# Patient Record
Sex: Male | Born: 1955
Health system: Southern US, Community
[De-identification: ages and names within clinical notes are randomized; demographics above are authoritative.]

## PROBLEM LIST (undated history)

## (undated) DIAGNOSIS — F32A Depression, unspecified: Secondary | ICD-10-CM

## (undated) DIAGNOSIS — G8929 Other chronic pain: Secondary | ICD-10-CM

## (undated) DIAGNOSIS — F329 Major depressive disorder, single episode, unspecified: Secondary | ICD-10-CM

## (undated) DIAGNOSIS — R739 Hyperglycemia, unspecified: Secondary | ICD-10-CM

## (undated) DIAGNOSIS — I82409 Acute embolism and thrombosis of unspecified deep veins of unspecified lower extremity: Secondary | ICD-10-CM

## (undated) DIAGNOSIS — I2699 Other pulmonary embolism without acute cor pulmonale: Secondary | ICD-10-CM

## (undated) DIAGNOSIS — F411 Generalized anxiety disorder: Secondary | ICD-10-CM

## (undated) HISTORY — DX: Generalized anxiety disorder: F41.1

## (undated) HISTORY — DX: Hyperglycemia, unspecified: R73.9

## (undated) HISTORY — DX: Major depressive disorder, single episode, unspecified: F32.9

## (undated) HISTORY — DX: Depression, unspecified: F32.A

## (undated) HISTORY — DX: Other pulmonary embolism without acute cor pulmonale: I26.99

## (undated) HISTORY — DX: Other chronic pain: G89.29

## (undated) HISTORY — DX: Acute embolism and thrombosis of unspecified deep veins of unspecified lower extremity: I82.409

---

## 2015-12-22 ENCOUNTER — Encounter: Payer: Self-pay | Admitting: Pulmonary Disease

## 2015-12-23 ENCOUNTER — Encounter: Payer: Self-pay | Admitting: Pulmonary Disease

## 2015-12-23 ENCOUNTER — Ambulatory Visit (INDEPENDENT_AMBULATORY_CARE_PROVIDER_SITE_OTHER): Payer: BLUE CROSS/BLUE SHIELD | Admitting: Pulmonary Disease

## 2015-12-23 ENCOUNTER — Encounter (INDEPENDENT_AMBULATORY_CARE_PROVIDER_SITE_OTHER): Payer: Self-pay

## 2015-12-23 VITALS — BP 122/90 | HR 82 | Ht 70.0 in | Wt 273.0 lb

## 2015-12-23 DIAGNOSIS — R0600 Dyspnea, unspecified: Secondary | ICD-10-CM

## 2015-12-23 DIAGNOSIS — R0683 Snoring: Secondary | ICD-10-CM

## 2015-12-23 MED ORDER — PREDNISONE 10 MG PO TABS: ORAL_TABLET | ORAL | Status: DC

## 2015-12-23 NOTE — Progress Notes (Addendum)
Subjective:    Patient ID: Randy GageJohn Medina, male    DOB: [birth year], 60 y.o.   MRN: [id number]  HPI Consult for evaluation of dyspnea  [name] is a 60 year old with heavy smoking history, active smoker referred for evaluation of COPD. He reports increasing dyspnea, wheezing over the past 2 weeks. He got a prednisone taper from his primary care physician that is finished today but however he still has persistent symptoms.  He used to live at [place], [place] and recently moved to [place] area this year for his wife's job reason. He still spends his time between [place] and [place]. He has chronic pain issues, back pain. His pain doctor is still in [place] and he travels there to get his pain medications from there. As per PCP note he has H/O PE DVT. He saw a hematologist in [place] and was told he had a mutation and will need to be on life long anticoagulation.  Social History: He is a current every day smoker with 30 pack year smoking history. He does not have any alcohol, drug use.  Family History: Noncontributory  Past Medical History:  Diagnosis Date  . Chronic pain   . Depression   . DVT (deep venous thrombosis) (HCC)   . GAD (generalized anxiety disorder)   . Hyperglycemia   . PE (pulmonary embolism)     Current Outpatient Prescriptions:  .  ADVAIR HFA 115-21 MCG/ACT inhaler, Inhale 2 puffs into the lungs 2 (two) times daily., Disp: , Rfl: 1 .  ALPRAZolam (XANAX) 1 MG tablet, Take 1 mg by mouth 3 (three) times daily as needed., Disp: , Rfl: 0 .  OXYCONTIN 40 MG 12 hr tablet, Take 40 mg by mouth 3 (three) times daily as needed., Disp: , Rfl: 0 .  OXYCONTIN 60 MG 12 hr tablet, Take 60 mg by mouth 4 (four) times daily., Disp: , Rfl: 0 .  VENTOLIN HFA 108 (90 Base) MCG/ACT inhaler, Inhale 2 puffs into the lungs every 6 (six) hours as needed., Disp: , Rfl: 1 .  Vitamin D, Ergocalciferol, (DRISDOL) 50000 units CAPS capsule, Take 1 capsule by mouth 2 (two) times a week., Disp:  , Rfl: 0 .  XARELTO 20 MG TABS tablet, Take 20 mg by mouth daily., Disp: , Rfl: 0 .  zolpidem (AMBIEN) 10 MG tablet, Take 10 mg by mouth at bedtime as needed., Disp: , Rfl: 0 .  predniSONE (DELTASONE) 10 MG tablet, Take 3 tabs x3 days, then 2 tabs x3 days, then 1 tab x3 days and stop., Disp: 18 tablet, Rfl: 0   Review of Systems     Objective:   Physical Exam Blood pressure 122/90, pulse 82, height 5\' 10"  (1.778 m), weight 273 lb (123.8 kg), SpO2 95 %. Gen: No apparent distress Neuro: No gross focal deficits. HEENT: No JVD, lymphadenopathy, thyromegaly. RS: + Exp wheeze, No crackles CVS: S1-S2 heard, no murmurs rubs gallops. Abdomen: Soft, positive bowel sounds. Musculoskeletal: No edema.    Assessment & Plan:  Consult for evaluation of dyspnea.  [name] likely has severe COPD based on his smoking history and symptoms. He has just finished a course of prednisone for exacerbation but is still symptomatic in the office today. I will give him another short course of prednisone. He is already started on Advair which he'll continue to use. I will schedule him for pulmonary function tests. He also has symptoms of sleep apnea and will need sleep study for further evaluation.  Plan: - Continue Advair - Short prednisone taper - Schedule PFTs and sleep study.  Chilton Greathouse MD [facility] Pager [phone] If no answer or after 3pm call: [phone] 01/12/2016, 8:01 AM

## 2015-12-23 NOTE — Patient Instructions (Signed)
We will give a prescription for prednisone taper starting to 30 mg. Reduce dose by 10 mg every 3 days. Use it if you have worsening of your breathing. We will schedule you for PFTs and split night sleep study Continue using the advair  Return to clinic in 3 months

## 2016-02-03 ENCOUNTER — Encounter (HOSPITAL_BASED_OUTPATIENT_CLINIC_OR_DEPARTMENT_OTHER): Payer: BLUE CROSS/BLUE SHIELD

## 2016-03-24 ENCOUNTER — Ambulatory Visit: Payer: BLUE CROSS/BLUE SHIELD | Admitting: Pulmonary Disease

## 2016-04-02 ENCOUNTER — Encounter (HOSPITAL_BASED_OUTPATIENT_CLINIC_OR_DEPARTMENT_OTHER): Payer: BLUE CROSS/BLUE SHIELD

## 2016-06-18 DIAGNOSIS — M5126 Other intervertebral disc displacement, lumbar region: Secondary | ICD-10-CM | POA: Diagnosis not present

## 2016-06-18 DIAGNOSIS — M545 Low back pain: Secondary | ICD-10-CM | POA: Diagnosis not present

## 2016-06-18 DIAGNOSIS — Z79899 Other long term (current) drug therapy: Secondary | ICD-10-CM | POA: Diagnosis not present

## 2016-06-18 DIAGNOSIS — M6289 Other specified disorders of muscle: Secondary | ICD-10-CM | POA: Diagnosis not present

## 2016-07-02 DIAGNOSIS — M6289 Other specified disorders of muscle: Secondary | ICD-10-CM | POA: Diagnosis not present

## 2016-07-02 DIAGNOSIS — M5126 Other intervertebral disc displacement, lumbar region: Secondary | ICD-10-CM | POA: Diagnosis not present

## 2016-07-02 DIAGNOSIS — M545 Low back pain: Secondary | ICD-10-CM | POA: Diagnosis not present

## 2016-07-02 DIAGNOSIS — Z79899 Other long term (current) drug therapy: Secondary | ICD-10-CM | POA: Diagnosis not present

## 2016-07-16 DIAGNOSIS — M6289 Other specified disorders of muscle: Secondary | ICD-10-CM | POA: Diagnosis not present

## 2016-07-16 DIAGNOSIS — Z79899 Other long term (current) drug therapy: Secondary | ICD-10-CM | POA: Diagnosis not present

## 2016-07-16 DIAGNOSIS — M5126 Other intervertebral disc displacement, lumbar region: Secondary | ICD-10-CM | POA: Diagnosis not present

## 2016-07-16 DIAGNOSIS — M545 Low back pain: Secondary | ICD-10-CM | POA: Diagnosis not present

## 2016-07-30 DIAGNOSIS — Z79899 Other long term (current) drug therapy: Secondary | ICD-10-CM | POA: Diagnosis not present

## 2016-07-30 DIAGNOSIS — M5126 Other intervertebral disc displacement, lumbar region: Secondary | ICD-10-CM | POA: Diagnosis not present

## 2016-07-30 DIAGNOSIS — M6289 Other specified disorders of muscle: Secondary | ICD-10-CM | POA: Diagnosis not present

## 2016-07-30 DIAGNOSIS — M545 Low back pain: Secondary | ICD-10-CM | POA: Diagnosis not present

## 2016-08-12 DIAGNOSIS — M5126 Other intervertebral disc displacement, lumbar region: Secondary | ICD-10-CM | POA: Diagnosis not present

## 2016-08-12 DIAGNOSIS — M5136 Other intervertebral disc degeneration, lumbar region: Secondary | ICD-10-CM | POA: Diagnosis not present

## 2016-08-12 DIAGNOSIS — M545 Low back pain: Secondary | ICD-10-CM | POA: Diagnosis not present

## 2016-08-12 DIAGNOSIS — R413 Other amnesia: Secondary | ICD-10-CM | POA: Diagnosis not present

## 2016-08-12 DIAGNOSIS — R2689 Other abnormalities of gait and mobility: Secondary | ICD-10-CM | POA: Diagnosis not present

## 2016-08-12 DIAGNOSIS — M4802 Spinal stenosis, cervical region: Secondary | ICD-10-CM | POA: Diagnosis not present

## 2016-08-12 DIAGNOSIS — Z79899 Other long term (current) drug therapy: Secondary | ICD-10-CM | POA: Diagnosis not present

## 2016-08-12 DIAGNOSIS — M6289 Other specified disorders of muscle: Secondary | ICD-10-CM | POA: Diagnosis not present

## 2016-08-20 DIAGNOSIS — R201 Hypoesthesia of skin: Secondary | ICD-10-CM | POA: Diagnosis not present

## 2016-08-20 DIAGNOSIS — M79604 Pain in right leg: Secondary | ICD-10-CM | POA: Diagnosis not present

## 2016-08-20 DIAGNOSIS — M545 Low back pain: Secondary | ICD-10-CM | POA: Diagnosis not present

## 2016-08-25 DIAGNOSIS — M545 Low back pain: Secondary | ICD-10-CM | POA: Diagnosis not present

## 2016-08-25 DIAGNOSIS — M5126 Other intervertebral disc displacement, lumbar region: Secondary | ICD-10-CM | POA: Diagnosis not present

## 2016-08-25 DIAGNOSIS — Z79899 Other long term (current) drug therapy: Secondary | ICD-10-CM | POA: Diagnosis not present

## 2016-08-25 DIAGNOSIS — M6289 Other specified disorders of muscle: Secondary | ICD-10-CM | POA: Diagnosis not present

## 2016-09-09 DIAGNOSIS — M5126 Other intervertebral disc displacement, lumbar region: Secondary | ICD-10-CM | POA: Diagnosis not present

## 2016-09-09 DIAGNOSIS — M6289 Other specified disorders of muscle: Secondary | ICD-10-CM | POA: Diagnosis not present

## 2016-09-09 DIAGNOSIS — M545 Low back pain: Secondary | ICD-10-CM | POA: Diagnosis not present

## 2016-09-09 DIAGNOSIS — Z79899 Other long term (current) drug therapy: Secondary | ICD-10-CM | POA: Diagnosis not present

## 2016-09-23 DIAGNOSIS — M545 Low back pain: Secondary | ICD-10-CM | POA: Diagnosis not present

## 2016-09-23 DIAGNOSIS — M5126 Other intervertebral disc displacement, lumbar region: Secondary | ICD-10-CM | POA: Diagnosis not present

## 2016-09-23 DIAGNOSIS — Z79899 Other long term (current) drug therapy: Secondary | ICD-10-CM | POA: Diagnosis not present

## 2016-09-23 DIAGNOSIS — M6289 Other specified disorders of muscle: Secondary | ICD-10-CM | POA: Diagnosis not present

## 2016-10-04 DIAGNOSIS — M6289 Other specified disorders of muscle: Secondary | ICD-10-CM | POA: Diagnosis not present

## 2016-10-04 DIAGNOSIS — M545 Low back pain: Secondary | ICD-10-CM | POA: Diagnosis not present

## 2016-10-04 DIAGNOSIS — M5126 Other intervertebral disc displacement, lumbar region: Secondary | ICD-10-CM | POA: Diagnosis not present

## 2016-10-04 DIAGNOSIS — Z79899 Other long term (current) drug therapy: Secondary | ICD-10-CM | POA: Diagnosis not present

## 2016-10-15 DIAGNOSIS — M6289 Other specified disorders of muscle: Secondary | ICD-10-CM | POA: Diagnosis not present

## 2016-10-15 DIAGNOSIS — M545 Low back pain: Secondary | ICD-10-CM | POA: Diagnosis not present

## 2016-10-15 DIAGNOSIS — M5126 Other intervertebral disc displacement, lumbar region: Secondary | ICD-10-CM | POA: Diagnosis not present

## 2016-10-15 DIAGNOSIS — Z79899 Other long term (current) drug therapy: Secondary | ICD-10-CM | POA: Diagnosis not present

## 2017-03-22 ENCOUNTER — Ambulatory Visit: Payer: BLUE CROSS/BLUE SHIELD | Admitting: Pulmonary Disease

## 2017-03-23 ENCOUNTER — Encounter: Payer: Self-pay | Admitting: Pulmonary Disease

## 2017-03-23 ENCOUNTER — Ambulatory Visit (INDEPENDENT_AMBULATORY_CARE_PROVIDER_SITE_OTHER): Payer: BLUE CROSS/BLUE SHIELD | Admitting: Pulmonary Disease

## 2017-03-23 ENCOUNTER — Ambulatory Visit (INDEPENDENT_AMBULATORY_CARE_PROVIDER_SITE_OTHER)
Admission: RE | Admit: 2017-03-23 | Discharge: 2017-03-23 | Disposition: A | Discharge status: Home / Self Care | Payer: BLUE CROSS/BLUE SHIELD | Source: Ambulatory Visit | Attending: Pulmonary Disease | Admitting: Pulmonary Disease

## 2017-03-23 VITALS — BP 130/68 | HR 95 | Ht 71.0 in | Wt 268.0 lb

## 2017-03-23 DIAGNOSIS — R0600 Dyspnea, unspecified: Secondary | ICD-10-CM | POA: Diagnosis not present

## 2017-03-23 DIAGNOSIS — Z122 Encounter for screening for malignant neoplasm of respiratory organs: Secondary | ICD-10-CM

## 2017-03-23 DIAGNOSIS — F17210 Nicotine dependence, cigarettes, uncomplicated: Secondary | ICD-10-CM

## 2017-03-23 MED ORDER — MOMETASONE FURO-FORMOTEROL FUM 200-5 MCG/ACT IN AERO: 2.0000 | INHALATION_SPRAY | Freq: Two times a day (BID) | RESPIRATORY_TRACT | 0 refills | Status: DC

## 2017-03-23 NOTE — Progress Notes (Signed)
Randy Medina    [id number]    [birth year]  Primary Care Physician:Randy Medina, Randy Medina, Randy Medina  Referring Physician: [name], Randy Medina, Randy Medina [address] [place], [place] [address]  Chief complaint:   Follow up for COPD, dyspnea  HPI: [name] is a 61 year old with heavy smoking history, active smoker referred for evaluation of COPD. He reports increasing dyspnea, wheezing over the past 2 weeks. He got a prednisone taper from his primary care physician that is finished today but however he still has persistent symptoms.  He used to live at [place] and recently moved to [place] area this year for his wife's job reason. He still spends his time between [place] and [place]. He has chronic pain issues, back pain. His pain doctor is still in [place] and he travels there to get his pain medications from there. As per PCP note he has H/O PE DVT. He saw a hematologist in [place] and was told he had a mutation and will need to be on life long anticoagulation.  Interim History:  At last visit PFTs and sleep study were ordered but he did not follow-up. He is since moved to [place] from [place] permanently and is here for follow-up. He continues to smoke but is trying to quit. He has dyspnea on exertion, cough with sputum production, occasional wheeze. No fevers, chills.  Outpatient Encounter Prescriptions as of 03/23/2017  Medication Sig  . ADVAIR HFA 115-21 MCG/ACT inhaler Inhale 2 puffs into the lungs 2 (two) times daily.  Marland Kitchen. ALPRAZolam (XANAX) 1 MG tablet Take 1 mg by mouth 3 (three) times daily as needed.  . OXYCONTIN 40 MG 12 hr tablet Take 40 mg by mouth 3 (three) times daily as needed.  . OXYCONTIN 60 MG 12 hr tablet Take 60 mg by mouth 4 (four) times daily.  . VENTOLIN HFA 108 (90 Base) MCG/ACT inhaler Inhale 2 puffs into the lungs every 6 (six) hours as needed.  . Vitamin D, Ergocalciferol, (DRISDOL) 50000 units CAPS capsule Take 1 capsule by mouth 2 (two) times a week.  Carlena Hurl.  XARELTO 20 MG TABS tablet Take 20 mg by mouth daily.  Marland Kitchen. zolpidem (AMBIEN) 10 MG tablet Take 10 mg by mouth at bedtime as needed.  . [DISCONTINUED] predniSONE (DELTASONE) 10 MG tablet Take 3 tabs x3 days, then 2 tabs x3 days, then 1 tab x3 days and stop.   No facility-administered encounter medications on file as of 03/23/2017.     Allergies as of 03/23/2017  . (No Known Allergies)    Past Medical History:  Diagnosis Date  . Chronic pain   . Depression   . DVT (deep venous thrombosis) (HCC)   . GAD (generalized anxiety disorder)   . Hyperglycemia   . PE (pulmonary embolism)     No past surgical history on file.  Family History  Problem Relation Age of Onset  . Lung cancer Father   . Diabetes Maternal Grandfather   . Diabetes Paternal Grandfather     Social History   Social History  . Marital status: Married    Spouse name: N/A  . Number of children: N/A  . Years of education: N/A   Occupational History  . Not on file.   Social History Main Topics  . Smoking status: Current Every Day Smoker    Packs/day: 0.50    Years: 30.00  . Smokeless tobacco: Never Used  . Alcohol use No  . Drug use:  No  . Sexual activity: Not on file   Other Topics Concern  . Not on file   Social History Narrative   Recently moved here from [place] 2017   Married   Retired International aid/development worker, on disability for COPD and back pain   Frequent travel between [place] and [place]          Review of systems: Review of Systems  Constitutional: Negative for fever and chills.  HENT: Negative.   Eyes: Negative for blurred vision.  Respiratory: as per HPI  Cardiovascular: Negative for chest pain and palpitations.  Gastrointestinal: Negative for vomiting, diarrhea, blood per rectum. Genitourinary: Negative for dysuria, urgency, frequency and hematuria.  Musculoskeletal: Negative for myalgias, back pain and joint pain.  Skin: Negative for itching and rash.  Neurological: Negative for dizziness,  tremors, focal weakness, seizures and loss of consciousness.  Endo/Heme/Allergies: Negative for environmental allergies.  Psychiatric/Behavioral: Negative for depression, suicidal ideas and hallucinations.  All other systems reviewed and are negative.  Physical Exam: Blood pressure 130/68, pulse 95, height 5\' 11"  (1.803 m), weight 268 lb (121.6 kg), SpO2 94 %. Gen:      No acute distress HEENT:  EOMI, sclera anicteric Neck:     No masses; no thyromegaly Lungs:    Scattered exp wheeze; normal respiratory effort CV:         Regular rate and rhythm; no murmurs Abd:      + bowel sounds; soft, non-tender; no palpable masses, no distension Ext:    No edema; adequate peripheral perfusion Skin:      Warm and dry; no rash Neuro: alert and oriented x 3 Psych: normal mood and affect  Data Reviewed: None available.  Assessment:  Consult for evaluation of dyspnea. [name] likely has COPD based on his smoking history and symptoms.  He continues on the Advair but still has symptoms. He has responded well to [facility] in the past and would like to switch his inhalers to that. I will schedule him for pulmonary function tests and get chest x-ray.   Active smoker He has tried nicotine patches, Chantix in the past without success. He is asking about vasping. I told him that if this is only thing that'll get him off cigarettes then it'll be worthwhile to try it Time spent counseling-5 minutes Referred for low-dose screening CT of the chest  Suspected sleep apnea He also has symptoms of sleep apnea. Sleep study was discussed but he deferred as his symptoms are improving. We will readdress this at next visit.  More then 1/2 the time of the 40 min visit was spent in counseling and/or coordination of care with the patient and family.  Plan/Recommendations: - Stop advair. Start dulera. Continue albuterol inhaler - CXR, PFTs.  - Smoking cessation. Want to try vaping.  - Referral for low dose screening  CT of the chest.    Chilton Greathouse Randy Medina [facility] Pager [phone] 03/23/2017, 10:47 AM  CC: Randy Corn, Randy Medina

## 2017-03-23 NOTE — Patient Instructions (Signed)
We will stop the Advair and start Dulera 200 Continue the albuterol inhaler as needed We'll schedule you for pulmonary function tests Continue to work on smoking cessation Follow-up and 1-2 months.

## 2017-05-09 ENCOUNTER — Encounter: Payer: Self-pay | Admitting: Pulmonary Disease

## 2017-05-09 ENCOUNTER — Ambulatory Visit (INDEPENDENT_AMBULATORY_CARE_PROVIDER_SITE_OTHER): Payer: BLUE CROSS/BLUE SHIELD | Admitting: Pulmonary Disease

## 2017-05-09 VITALS — BP 110/74 | HR 76 | Ht 69.0 in | Wt 264.0 lb

## 2017-05-09 DIAGNOSIS — J449 Chronic obstructive pulmonary disease, unspecified: Secondary | ICD-10-CM

## 2017-05-09 DIAGNOSIS — R05 Cough: Secondary | ICD-10-CM | POA: Diagnosis not present

## 2017-05-09 DIAGNOSIS — R0600 Dyspnea, unspecified: Secondary | ICD-10-CM | POA: Diagnosis not present

## 2017-05-09 DIAGNOSIS — R059 Cough, unspecified: Secondary | ICD-10-CM

## 2017-05-09 DIAGNOSIS — Z122 Encounter for screening for malignant neoplasm of respiratory organs: Secondary | ICD-10-CM

## 2017-05-09 LAB — PULMONARY FUNCTION TEST
DL/VA % pred: 115 %
DL/VA: 5.27 ml/min/mmHg/L
DLCO cor % pred: 98 %
DLCO cor: 30.42 ml/min/mmHg
DLCO unc % pred: 95 %
DLCO unc: 29.52 ml/min/mmHg
FEF 25-75 Post: 1.84 L/sec
FEF 25-75 Pre: 1.34 L/sec
FEF2575-%Change-Post: 37 %
FEF2575-%Pred-Post: 63 %
FEF2575-%Pred-Pre: 46 %
FEV1-%Change-Post: 12 %
FEV1-%Pred-Post: 68 %
FEV1-%Pred-Pre: 60 %
FEV1-Post: 2.38 L
FEV1-Pre: 2.12 L
FEV1FVC-%Change-Post: 3 %
FEV1FVC-%Pred-Pre: 86 %
FEV6-%Change-Post: 7 %
FEV6-%Pred-Post: 77 %
FEV6-%Pred-Pre: 72 %
FEV6-Post: 3.43 L
FEV6-Pre: 3.19 L
FEV6FVC-%Change-Post: -1 %
FEV6FVC-%Pred-Post: 103 %
FEV6FVC-%Pred-Pre: 104 %
FVC-%Change-Post: 8 %
FVC-%Pred-Post: 75 %
FVC-%Pred-Pre: 69 %
FVC-Post: 3.5 L
FVC-Pre: 3.21 L
Post FEV1/FVC ratio: 68 %
Post FEV6/FVC ratio: 98 %
Pre FEV1/FVC ratio: 66 %
Pre FEV6/FVC Ratio: 99 %
RV % pred: 192 %
RV: 4.18 L
TLC % pred: 109 %
TLC: 7.45 L

## 2017-05-09 MED ORDER — MOMETASONE FURO-FORMOTEROL FUM 200-5 MCG/ACT IN AERO: 2.0000 | INHALATION_SPRAY | Freq: Two times a day (BID) | RESPIRATORY_TRACT | 3 refills | Status: DC

## 2017-05-09 MED ORDER — PREDNISONE 10 MG PO TABS: 10.0000 mg | ORAL_TABLET | Freq: Every day | ORAL | 0 refills | Status: DC

## 2017-05-09 NOTE — Progress Notes (Signed)
PFT done today. 

## 2017-05-09 NOTE — Progress Notes (Signed)
Randy Medina    [id number]    [birth year]  Primary Care Physician:Russo, Jonny RuizJohn, MD  Referring Physician: Creola Cornusso, Bobbie, MD [address] [place], [place] [address]  Chief complaint:   Follow up for COPD, dyspnea  HPI: [name] is a 61 year old with heavy smoking history, active smoker referred for evaluation of COPD. He reports increasing dyspnea, wheezing over the past 2 weeks. He got a prednisone taper from his primary care physician that is finished today but however he still has persistent symptoms.  He used to live at [place] and recently moved to [place] area this year for his wife's job reason. He still spends his time between [place] and [place]. He has chronic pain issues, back pain. His pain doctor is still in [place] and he travels there to get his pain medications from there. As per PCP note he has H/O PE DVT. He saw a hematologist in [place] and was told he had a mutation and will need to be on life long anticoagulation.  Interim History:  Continues to have chronic dyspnea on exertion, cough with white sputum production.  He had used samples of Dulera in the past and feels that this helps much better with his breathing than advair.  He is requesting prednisone taper.  Outpatient Encounter Medications as of 05/09/2017  Medication Sig  . ALPRAZolam (XANAX) 1 MG tablet Take 1 mg by mouth 3 (three) times daily as needed.  . fluticasone-salmeterol (ADVAIR HFA) 115-21 MCG/ACT inhaler Inhale 2 puffs into the lungs 2 (two) times daily.  . furosemide (LASIX) 20 MG tablet Take 20 mg by mouth daily.  . mometasone-formoterol (DULERA) 200-5 MCG/ACT AERO Inhale 2 puffs into the lungs 2 (two) times daily.  . OXYCONTIN 60 MG 12 hr tablet Take 80 mg by mouth 3 (three) times daily.   . VENTOLIN HFA 108 (90 Base) MCG/ACT inhaler Inhale 2 puffs into the lungs every 6 (six) hours as needed.  . Vitamin D, Ergocalciferol, (DRISDOL) 50000 units CAPS capsule Take 1 capsule  by mouth 2 (two) times a week.  Carlena Hurl. XARELTO 20 MG TABS tablet Take 20 mg by mouth daily.  Marland Kitchen. zolpidem (AMBIEN) 10 MG tablet Take 10 mg by mouth at bedtime as needed.  . [DISCONTINUED] OXYCONTIN 40 MG 12 hr tablet Take 40 mg by mouth 3 (three) times daily as needed.   No facility-administered encounter medications on file as of 05/09/2017.     Allergies as of 05/09/2017  . (No Known Allergies)    Past Medical History:  Diagnosis Date  . Chronic pain   . Depression   . DVT (deep venous thrombosis) (HCC)   . GAD (generalized anxiety disorder)   . Hyperglycemia   . PE (pulmonary embolism)     History reviewed. No pertinent surgical history.  Family History  Problem Relation Age of Onset  . Lung cancer Father   . Diabetes Maternal Grandfather   . Diabetes Paternal Grandfather     Social History   Socioeconomic History  . Marital status: Married    Spouse name: Not on file  . Number of children: Not on file  . Years of education: Not on file  . Highest education level: Not on file  Social Needs  . Financial resource strain: Not on file  . Food insecurity - worry: Not on file  . Food insecurity - inability: Not on file  . Transportation needs - medical: Not on file  .  Transportation needs - non-medical: Not on file  Occupational History  . Not on file  Tobacco Use  . Smoking status: Current Every Day Smoker    Packs/day: 0.50    Years: 30.00    Pack years: 15.00  . Smokeless tobacco: Never Used  Substance and Sexual Activity  . Alcohol use: No  . Drug use: No  . Sexual activity: Not on file  Other Topics Concern  . Not on file  Social History Narrative   Recently moved here from [place] 2017   Married   Retired International aid/development workerdriller, on disability for COPD and back pain   [place]requent travel between [place] and [place]       Review of systems: Review of Systems  Constitutional: Negative for fever and chills.  HENT: Negative.   Eyes: Negative for blurred vision.  Respiratory:  as per HPI  Cardiovascular: Negative for chest pain and palpitations.  Gastrointestinal: Negative for vomiting, diarrhea, blood per rectum. Genitourinary: Negative for dysuria, urgency, frequency and hematuria.  Musculoskeletal: Negative for myalgias, back pain and joint pain.  Skin: Negative for itching and rash.  Neurological: Negative for dizziness, tremors, focal weakness, seizures and loss of consciousness.  Endo/Heme/Allergies: Negative for environmental allergies.  Psychiatric/Behavioral: Negative for depression, suicidal ideas and hallucinations.  All other systems reviewed and are negative.  Physical Exam: Blood pressure 110/74, pulse 76, height 5\' 9"  (1.753 m), weight 264 lb (119.7 kg), SpO2 93 %. Gen:      No acute distress HEENT:  EOMI, sclera anicteric Neck:     No masses; no thyromegaly Lungs:    Clear to auscultation bilaterally; normal respiratory effort* CV:         Regular rate and rhythm; no murmurs Abd:      + bowel sounds; soft, non-tender; no palpable masses, no distension Ext:    No edema; adequate peripheral perfusion Skin:      Warm and dry; no rash Neuro: alert and oriented x 3 Psych: normal mood and affect  Data Reviewed: Chest x-ray 03/23/17-hyperinflation, no acute abnormality  PFTs 05/09/17 FVC 3.5 (75%], FEV1 2.38 [68%), F/F 68, TLC 109%.,  RV/TLC 175%, DLCO 95% Moderate obstruction with bronchodilator response, air trapping  Assessment:  Follow up for COPD He continues on the Advair but feels that dulera works much better for him.  [facility] reviewed which show moderate obstruction with bronchodilator response and air trapping.   As he continues to have dyspnea with congestion we will call in a prednisone taper.  He does not require any antibiotics at present.  Active smoker He has tried nicotine patches, Chantix in the past without success.  He wants to try vaping to get off cigarettes completely.  Time spent counseling-5  minutes Referred for low-dose screening CT of the chest  Suspected sleep apnea He also has symptoms of sleep apnea. Sleep study was discussed but he deferred as his symptoms are improving.   Plan/Recommendations: - Stop advair. Start dulera. Continue albuterol inhaler - Prednisone taper - Smoking cessation. Want to try vaping.  - Referral for low dose screening CT of the chest.    Chilton GreathousePraveen Roma Bierlein MD [facility] Pager [phone] 05/09/2017, 1:38 PM  CC: Creola Cornusso, Amil, MD

## 2017-05-09 NOTE — Patient Instructions (Signed)
We will schedule you for screening CTs of the chest Continue to work on smoking cessation We will stop the Advair and start Dulera 200/5.  Will send in a prescription for this We will continue prednisone taper starting at 40 mg.  Reduce dose by 10 mg every 3 days Follow-up in 6 months.

## 2017-05-24 ENCOUNTER — Ambulatory Visit (INDEPENDENT_AMBULATORY_CARE_PROVIDER_SITE_OTHER)
Admission: RE | Admit: 2017-05-24 | Discharge: 2017-05-24 | Disposition: A | Discharge status: Home / Self Care | Payer: Medicare Other | Source: Ambulatory Visit | Attending: Pulmonary Disease | Admitting: Pulmonary Disease

## 2017-05-24 DIAGNOSIS — R05 Cough: Secondary | ICD-10-CM

## 2017-05-24 DIAGNOSIS — R059 Cough, unspecified: Secondary | ICD-10-CM

## 2017-05-30 ENCOUNTER — Other Ambulatory Visit: Payer: Self-pay

## 2017-05-30 DIAGNOSIS — R911 Solitary pulmonary nodule: Secondary | ICD-10-CM

## 2017-05-30 MED ORDER — AZITHROMYCIN 250 MG PO TABS: ORAL_TABLET | ORAL | 0 refills | Status: AC

## 2017-08-01 DIAGNOSIS — M4722 Other spondylosis with radiculopathy, cervical region: Secondary | ICD-10-CM | POA: Diagnosis not present

## 2017-08-30 ENCOUNTER — Institutional Professional Consult (permissible substitution): Payer: Self-pay | Admitting: Neurology

## 2017-08-30 ENCOUNTER — Telehealth: Payer: Self-pay | Admitting: Neurology

## 2017-08-30 NOTE — Telephone Encounter (Signed)
Patient called today stating that he will not make apt. Was advised of no show fee

## 2017-08-31 ENCOUNTER — Encounter: Payer: Self-pay | Admitting: Neurology

## 2017-09-02 ENCOUNTER — Telehealth: Payer: Self-pay | Admitting: Pulmonary Disease

## 2017-09-02 MED ORDER — VENTOLIN HFA 108 (90 BASE) MCG/ACT IN AERS: 2.0000 | INHALATION_SPRAY | Freq: Four times a day (QID) | RESPIRATORY_TRACT | 2 refills | Status: DC | PRN

## 2017-09-02 MED ORDER — MOMETASONE FURO-FORMOTEROL FUM 200-5 MCG/ACT IN AERO: 2.0000 | INHALATION_SPRAY | Freq: Two times a day (BID) | RESPIRATORY_TRACT | 3 refills | Status: DC

## 2017-09-02 NOTE — Telephone Encounter (Signed)
Called and spoke with pt verifying the meds he was needing a refill of and also verified pt's pharmacy.  Stated to pt I would be sending meds to preferred pharmacy.  Pt expressed understanding. Scripts sent to pt's pharmacy. Nothing further needed at this time.

## 2017-09-06 DIAGNOSIS — G894 Chronic pain syndrome: Secondary | ICD-10-CM | POA: Diagnosis not present

## 2017-09-06 DIAGNOSIS — M542 Cervicalgia: Secondary | ICD-10-CM | POA: Diagnosis not present

## 2017-09-06 DIAGNOSIS — M545 Low back pain: Secondary | ICD-10-CM | POA: Diagnosis not present

## 2017-09-06 DIAGNOSIS — M79604 Pain in right leg: Secondary | ICD-10-CM | POA: Diagnosis not present

## 2017-09-13 ENCOUNTER — Other Ambulatory Visit: Payer: Self-pay

## 2017-09-13 MED ORDER — VENTOLIN HFA 108 (90 BASE) MCG/ACT IN AERS: 2.0000 | INHALATION_SPRAY | Freq: Four times a day (QID) | RESPIRATORY_TRACT | 2 refills | Status: DC | PRN

## 2017-09-29 DIAGNOSIS — R0981 Nasal congestion: Secondary | ICD-10-CM | POA: Diagnosis not present

## 2017-09-29 DIAGNOSIS — J069 Acute upper respiratory infection, unspecified: Secondary | ICD-10-CM | POA: Diagnosis not present

## 2017-09-29 DIAGNOSIS — J029 Acute pharyngitis, unspecified: Secondary | ICD-10-CM | POA: Diagnosis not present

## 2017-09-29 DIAGNOSIS — R05 Cough: Secondary | ICD-10-CM | POA: Diagnosis not present

## 2017-10-04 DIAGNOSIS — Z6841 Body Mass Index (BMI) 40.0 and over, adult: Secondary | ICD-10-CM | POA: Diagnosis not present

## 2017-10-04 DIAGNOSIS — J449 Chronic obstructive pulmonary disease, unspecified: Secondary | ICD-10-CM | POA: Diagnosis not present

## 2017-10-04 DIAGNOSIS — L0390 Cellulitis, unspecified: Secondary | ICD-10-CM | POA: Diagnosis not present

## 2017-10-04 DIAGNOSIS — M79644 Pain in right finger(s): Secondary | ICD-10-CM | POA: Diagnosis not present

## 2017-10-04 DIAGNOSIS — J40 Bronchitis, not specified as acute or chronic: Secondary | ICD-10-CM | POA: Diagnosis not present

## 2017-10-04 DIAGNOSIS — I8310 Varicose veins of unspecified lower extremity with inflammation: Secondary | ICD-10-CM | POA: Diagnosis not present

## 2017-11-17 ENCOUNTER — Other Ambulatory Visit: Payer: Self-pay | Admitting: Pulmonary Disease

## 2017-12-02 ENCOUNTER — Telehealth: Payer: Self-pay | Admitting: Pulmonary Disease

## 2017-12-02 ENCOUNTER — Other Ambulatory Visit (INDEPENDENT_AMBULATORY_CARE_PROVIDER_SITE_OTHER): Payer: BLUE CROSS/BLUE SHIELD

## 2017-12-02 ENCOUNTER — Encounter: Payer: Self-pay | Admitting: Pulmonary Disease

## 2017-12-02 ENCOUNTER — Ambulatory Visit (INDEPENDENT_AMBULATORY_CARE_PROVIDER_SITE_OTHER)
Admission: RE | Admit: 2017-12-02 | Discharge: 2017-12-02 | Disposition: A | Discharge status: Home / Self Care | Payer: BLUE CROSS/BLUE SHIELD | Source: Ambulatory Visit | Attending: Pulmonary Disease | Admitting: Pulmonary Disease

## 2017-12-02 ENCOUNTER — Ambulatory Visit (INDEPENDENT_AMBULATORY_CARE_PROVIDER_SITE_OTHER): Payer: BLUE CROSS/BLUE SHIELD | Admitting: Pulmonary Disease

## 2017-12-02 VITALS — BP 128/70 | HR 82 | Ht 70.0 in | Wt 272.0 lb

## 2017-12-02 DIAGNOSIS — J449 Chronic obstructive pulmonary disease, unspecified: Secondary | ICD-10-CM

## 2017-12-02 DIAGNOSIS — R062 Wheezing: Secondary | ICD-10-CM | POA: Diagnosis not present

## 2017-12-02 DIAGNOSIS — R911 Solitary pulmonary nodule: Secondary | ICD-10-CM | POA: Diagnosis not present

## 2017-12-02 DIAGNOSIS — R918 Other nonspecific abnormal finding of lung field: Secondary | ICD-10-CM | POA: Diagnosis not present

## 2017-12-02 LAB — CBC WITH DIFFERENTIAL/PLATELET
Basophils Absolute: 0.1 10*3/uL (ref 0.0–0.1)
Basophils Relative: 0.7 % (ref 0.0–3.0)
Eosinophils Absolute: 0.1 10*3/uL (ref 0.0–0.7)
Eosinophils Relative: 1.3 % (ref 0.0–5.0)
HCT: 44 % (ref 39.0–52.0)
Hemoglobin: 14.5 g/dL (ref 13.0–17.0)
Lymphocytes Relative: 25.3 % (ref 12.0–46.0)
Lymphs Abs: 2.3 10*3/uL (ref 0.7–4.0)
MCHC: 32.9 g/dL (ref 30.0–36.0)
MCV: 88.2 fl (ref 78.0–100.0)
Monocytes Absolute: 0.4 10*3/uL (ref 0.1–1.0)
Monocytes Relative: 4.8 % (ref 3.0–12.0)
Neutro Abs: 6.1 10*3/uL (ref 1.4–7.7)
Neutrophils Relative %: 67.9 % (ref 43.0–77.0)
Platelets: 223 10*3/uL (ref 150.0–400.0)
RBC: 4.99 Mil/uL (ref 4.22–5.81)
RDW: 15.9 % — ABNORMAL HIGH (ref 11.5–15.5)
WBC: 8.9 10*3/uL (ref 4.0–10.5)

## 2017-12-02 MED ORDER — FLUTICASONE-UMECLIDIN-VILANT 100-62.5-25 MCG/INH IN AEPB: 1.0000 | INHALATION_SPRAY | Freq: Every day | RESPIRATORY_TRACT | 0 refills | Status: AC

## 2017-12-02 MED ORDER — FLUTICASONE-UMECLIDIN-VILANT 100-62.5-25 MCG/INH IN AEPB: 1.0000 | INHALATION_SPRAY | Freq: Every day | RESPIRATORY_TRACT | 4 refills | Status: DC

## 2017-12-02 MED ORDER — PREDNISONE 10 MG PO TABS: ORAL_TABLET | ORAL | 0 refills | Status: DC

## 2017-12-02 NOTE — Telephone Encounter (Signed)
predniSONE (DELTASONE) 10 MG tablet 30 tablet 0 12/02/2017    Sig: 4 tabs x 3 days, 3 tabs x 3 days, 2 tabs x 3 days, 1 tabs x 3 days then stop   Sent to pharmacy as: predniSONE (DELTASONE) 10 MG tablet   E-Prescribing Status: Receipt confirmed by pharmacy (12/02/2017 2:48 PM EDT)      Spoke with patient-aware that Rx is at pharmacy and the pharmacy is filling it now. Will be ready for pick up around 6pm. Nothing more needed at this time.

## 2017-12-02 NOTE — Progress Notes (Signed)
Randy Medina    [id number]    1956/03/22  Primary Care Physician:Russo, Jonny Ruiz, MD  Referring Physician: Creola Corn, MD [address] [place], [place] [address]  Chief complaint:   Follow up for COPD, dyspnea  HPI: [name] is a 62 year old with heavy smoking history, active smoker referred for evaluation of COPD.   He used to live at [place], [place] and recently moved to [place] area this year for his wife's job reason. He still spends his time between [place] and [place]. He has chronic pain issues, back pain. His pain doctor is still in [place] and he travels there to get his pain medications from there. As per PCP note he has H/O PE DVT. He saw a hematologist in [place] and was told he had a mutation and will need to be on life long anticoagulation.  Interim History:  Had a CT scan in December which showed mild inflammation suggestive of atypical infection.  He got Z-Pak for this. States that breathing is worse since last time.  He is using his albuterol every day due to chest congestion, wheezing. Continues on the [facility] Continues to smoke but wants to quit.  Outpatient Encounter Medications as of 12/02/2017  Medication Sig  . ALPRAZolam (XANAX) 1 MG tablet Take 1 mg by mouth 3 (three) times daily as needed.  . furosemide (LASIX) 20 MG tablet Take 20 mg by mouth daily.  . mometasone-formoterol (DULERA) 200-5 MCG/ACT AERO Inhale 2 puffs into the lungs 2 (two) times daily.  . VENTOLIN HFA 108 (90 Base) MCG/ACT inhaler INHALE 2 PUFFS INTO THE LUNGS EVERY 6 HOURS AS NEEDED  . Vitamin D, Ergocalciferol, (DRISDOL) 50000 units CAPS capsule Take 1 capsule by mouth 2 (two) times a week.  Carlena Hurl 20 MG TABS tablet Take 20 mg by mouth daily.  Marland Kitchen XTAMPZA ER 36 MG C12A Take 2 capsules by mouth 3 (three) times daily.  . [DISCONTINUED] OXYCONTIN 60 MG 12 hr tablet Take 80 mg by mouth 3 (three) times daily.   . [DISCONTINUED] predniSONE (DELTASONE) 10 MG tablet Take 1  tablet (10 mg total) by mouth daily with breakfast. Please start prednisone taper at 40mg  and reduce dose by 10mg  every 3 days.  . [DISCONTINUED] VENTOLIN HFA 108 (90 Base) MCG/ACT inhaler Inhale 2 puffs into the lungs every 6 (six) hours as needed.  . [DISCONTINUED] zolpidem (AMBIEN) 10 MG tablet Take 10 mg by mouth at bedtime as needed.   No facility-administered encounter medications on file as of 12/02/2017.     Allergies as of 12/02/2017  . (No Known Allergies)    Past Medical History:  Diagnosis Date  . Chronic pain   . Depression   . DVT (deep venous thrombosis) (HCC)   . GAD (generalized anxiety disorder)   . Hyperglycemia   . PE (pulmonary embolism)     No past surgical history on file.  Family History  Problem Relation Age of Onset  . Lung cancer Father   . Diabetes Maternal Grandfather   . Diabetes Paternal Grandfather     Social History   Socioeconomic History  . Marital status: Married    Spouse name: Not on file  . Number of children: Not on file  . Years of education: Not on file  . Highest education level: Not on file  Occupational History  . Not on file  Social Needs  . Financial resource strain: Not on file  . Food  insecurity:    Worry: Not on file    Inability: Not on file  . Transportation needs:    Medical: Not on file    Non-medical: Not on file  Tobacco Use  . Smoking status: Current Every Day Smoker    Packs/day: 0.50    Years: 30.00    Pack years: 15.00  . Smokeless tobacco: Never Used  Substance and Sexual Activity  . Alcohol use: No  . Drug use: No  . Sexual activity: Not on file  Lifestyle  . Physical activity:    Days per week: Not on file    Minutes per session: Not on file  . Stress: Not on file  Relationships  . Social connections:    Talks on phone: Not on file    Gets together: Not on file    Attends religious service: Not on file    Active member of club or organization: Not on file    Attends meetings of clubs or  organizations: Not on file    Relationship status: Not on file  . Intimate partner violence:    Fear of current or ex partner: Not on file    Emotionally abused: Not on file    Physically abused: Not on file    Forced sexual activity: Not on file  Other Topics Concern  . Not on file  Social History Narrative   [place] moved [place] from [place] 2017   Married   Retired International aid/development workerdriller, on disability for COPD and back pain   Frequent travel between [place] and [place]      Review of systems: Review of Systems  Constitutional: Negative for fever and chills.  HENT: Negative.   Eyes: Negative for blurred vision.  Respiratory: as per HPI  Cardiovascular: Negative for chest pain and palpitations.  Gastrointestinal: Negative for vomiting, diarrhea, blood per rectum. Genitourinary: Negative for dysuria, urgency, frequency and hematuria.  Musculoskeletal: Negative for myalgias, back pain and joint pain.  Skin: Negative for itching and rash.  Neurological: Negative for dizziness, tremors, focal weakness, seizures and loss of consciousness.  Endo/Heme/Allergies: Negative for environmental allergies.  Psychiatric/Behavioral: Negative for depression, suicidal ideas and hallucinations.  All other systems reviewed and are negative.  Physical Exam: Blood pressure 110/74, pulse 76, height 5\' 9"  (1.753 m), weight 264 lb (119.7 kg), SpO2 93 %. Gen:      No acute distress HEENT:  EOMI, sclera anicteric Neck:     No masses; no thyromegaly Lungs:    Bilateral expiratory wheeze, no crackles. CV:         Regular rate and rhythm; no murmurs Abd:      + bowel sounds; soft, non-tender; no palpable masses, no distension Ext:    No edema; adequate peripheral perfusion Skin:      Warm and dry; no rash Neuro: alert and oriented x 3 Psych: normal mood and affect  Data Reviewed: Chest x-ray 03/23/17-hyperinflation, no acute abnormality CT chest 05/24/17- groundglass opacities in right upper lobe.  3 mm left upper  lobe nodule.  Adrenal nodule.  Aortic atherosclerosis.  I have reviewed the images personally.  PFTs 05/09/17 FVC 3.5 (75%], FEV1 2.38 [68%), F/F 68, TLC 109%.,  RV/TLC 175%, DLCO 95% Moderate obstruction with bronchodilator response, air trapping  Assessment:  Follow up for COPD Has worsening symptoms with wheezing on examination We will stop Dulera, start trelegy Give prednisone taper starting at 40 mg.  Reduce dose by 10 mg every 3 days.  CT scan from  December 18 showed atypical pneumonia and was treated with Z-Pak.  Repeat chest x-ray today. Evaluate for reactive airway disease, asthma with CBC differential, blood allergy profile.  Check alpha-1 antitrypsin levels and phenotype  Pulmonary nodule CT scan reviewed with subcentimeter pulmonary nodule Follow-up in 1 year.  Active smoker Discussed smoking cessation again.  He had tried nicotine patches, Chantix in the past without success.  He wants to try and quit this time on his own.  Time spent counseling-5 minutes.  Reevaluate at next visit  Health maintenance 02/05/2017-influenza States that he got pneumonia vaccine at his primary care office.  Plan/Recommendations: - CXR, prednisone taper - Stop Dulera, start trelegy - Smoking cessation   Chilton Greathouse MD [facility] 12/02/2017, 2:24 PM  CC: Randy Corn, MD

## 2017-12-02 NOTE — Patient Instructions (Addendum)
We will get a chest x-ray today, CBC differential, blood allergy profile, alpha-1 antitrypsin levels and phenotype We will give a prednisone taper starting at 40 mg.  Reduce dose by 10 mg every 3 days [facility] the [facility].  We will try you on trelegy Continue to work on smoking cessation

## 2017-12-06 ENCOUNTER — Telehealth: Payer: Self-pay | Admitting: Pulmonary Disease

## 2017-12-06 DIAGNOSIS — M545 Low back pain: Secondary | ICD-10-CM | POA: Diagnosis not present

## 2017-12-06 DIAGNOSIS — Z79899 Other long term (current) drug therapy: Secondary | ICD-10-CM | POA: Diagnosis not present

## 2017-12-06 DIAGNOSIS — M79604 Pain in right leg: Secondary | ICD-10-CM | POA: Diagnosis not present

## 2017-12-06 DIAGNOSIS — M542 Cervicalgia: Secondary | ICD-10-CM | POA: Diagnosis not present

## 2017-12-06 DIAGNOSIS — Z5181 Encounter for therapeutic drug level monitoring: Secondary | ICD-10-CM | POA: Diagnosis not present

## 2017-12-06 DIAGNOSIS — G894 Chronic pain syndrome: Secondary | ICD-10-CM | POA: Diagnosis not present

## 2017-12-06 LAB — RESPIRATORY ALLERGY PROFILE REGION II ~~LOC~~
Allergen, A. alternata, m6: 2.38 kU/L — ABNORMAL HIGH
Allergen, Cedar tree, t12: 2.3 kU/L — ABNORMAL HIGH
Allergen, Comm Silver Birch, t9: 1.2 kU/L — ABNORMAL HIGH
Allergen, Cottonwood, t14: 0.13 kU/L — ABNORMAL HIGH
Allergen, D pternoyssinus,d7: 1.9 kU/L — ABNORMAL HIGH
Allergen, Mouse Urine Protein, e78: <0.1 kU/L
Allergen, Mulberry, t76: <0.1 kU/L
Allergen, Oak,t7: 2.57 kU/L — ABNORMAL HIGH
Allergen, P. notatum, m1: 0.93 kU/L — ABNORMAL HIGH
Aspergillus fumigatus, m3: 3.14 kU/L — ABNORMAL HIGH
Bermuda Grass: <0.1 kU/L
Box Elder IgE: 0.11 kU/L — ABNORMAL HIGH
CLADOSPORIUM HERBARUM (M2) IGE: 0.5 kU/L — ABNORMAL HIGH
COMMON RAGWEED (SHORT) (W1) IGE: 0.82 kU/L — ABNORMAL HIGH
Cat Dander: 68.6 kU/L — ABNORMAL HIGH
Class: 0
Class: 0
Class: 0
Class: 0
Class: 0
Class: 0
Class: 0
Class: 0
Class: 0
Class: 0
Class: 0
Class: 1
Class: 1
Class: 2
Class: 2
Class: 2
Class: 2
Class: 2
Class: 2
Class: 2
Class: 2
Class: 2
Class: 3
Class: 5
Cockroach: 0.12 kU/L — ABNORMAL HIGH
D. farinae: 2.69 kU/L — ABNORMAL HIGH
Dog Dander: 8.31 kU/L — ABNORMAL HIGH
Elm IgE: 0.18 kU/L — ABNORMAL HIGH
IgE (Immunoglobulin E), Serum: 723 kU/L — ABNORMAL HIGH (ref <=114)
Johnson Grass: <0.1 kU/L
Pecan/Hickory Tree IgE: <0.1 kU/L
Rough Pigweed  IgE: 0.24 kU/L — ABNORMAL HIGH
Sheep Sorrel IgE: <0.1 kU/L
Timothy Grass: 0.37 kU/L — ABNORMAL HIGH

## 2017-12-06 LAB — INTERPRETATION:

## 2017-12-06 LAB — ALPHA-1 ANTITRYPSIN PHENOTYPE: A-1 Antitrypsin, Ser: 163 mg/dL (ref 83–199)

## 2017-12-06 NOTE — Telephone Encounter (Signed)
Called and spoke with pt to clarify the different things that showed up for him being allergic to from the labwork.  Pt expressed understanding. Stated to pt we could schedule him for a sooner appt if he wanted to come in to go over labwork in more detail and then go from there to see what the next step was.  Pt stated he would think about it and if he wanted to schedule a sooner appt, he would call.  Nothing further needed.

## 2018-02-28 DIAGNOSIS — M542 Cervicalgia: Secondary | ICD-10-CM | POA: Diagnosis not present

## 2018-02-28 DIAGNOSIS — M545 Low back pain: Secondary | ICD-10-CM | POA: Diagnosis not present

## 2018-02-28 DIAGNOSIS — G894 Chronic pain syndrome: Secondary | ICD-10-CM | POA: Diagnosis not present

## 2018-02-28 DIAGNOSIS — M79604 Pain in right leg: Secondary | ICD-10-CM | POA: Diagnosis not present

## 2018-04-15 ENCOUNTER — Other Ambulatory Visit: Payer: Self-pay | Admitting: Pulmonary Disease

## 2018-05-09 ENCOUNTER — Ambulatory Visit: Payer: BLUE CROSS/BLUE SHIELD | Admitting: Pulmonary Disease

## 2018-05-10 ENCOUNTER — Encounter: Payer: Self-pay | Admitting: Pulmonary Disease

## 2018-05-10 ENCOUNTER — Ambulatory Visit (INDEPENDENT_AMBULATORY_CARE_PROVIDER_SITE_OTHER): Payer: BLUE CROSS/BLUE SHIELD | Admitting: Pulmonary Disease

## 2018-05-10 VITALS — BP 130/62 | HR 92 | Ht 69.69 in | Wt 292.0 lb

## 2018-05-10 DIAGNOSIS — J449 Chronic obstructive pulmonary disease, unspecified: Secondary | ICD-10-CM | POA: Diagnosis not present

## 2018-05-10 DIAGNOSIS — Z23 Encounter for immunization: Secondary | ICD-10-CM | POA: Diagnosis not present

## 2018-05-10 DIAGNOSIS — R911 Solitary pulmonary nodule: Secondary | ICD-10-CM | POA: Diagnosis not present

## 2018-05-10 DIAGNOSIS — F17210 Nicotine dependence, cigarettes, uncomplicated: Secondary | ICD-10-CM | POA: Diagnosis not present

## 2018-05-10 NOTE — Progress Notes (Signed)
Randy Medina    [id number]    [birth year]  Primary Care Physician:Russo, Jonny Ruiz, MD  Referring Physician: Creola Corn, MD [address] [place], [place] [address]  Chief complaint:   Follow up for COPD, dyspnea  HPI: Randy Medina is a 62 year old with heavy smoking history, active smoker, chronic back pain on opiate medication.  Referred for evaluation of COPD.  He used to live at [place], [place] and recently moved to [place] area in 2017 for his wife's job reason. He still spends his time between [place] and [place]. He has chronic pain issues, back pain. His pain doctor is still in [place] and he travels there to get his pain medications from there. As per PCP note he has H/O PE DVT. He saw a hematologist in [place] and was told he had a mutation and will need to be on life long anticoagulation.  Had a CT scan in December 19 which showed mild inflammation suggestive of atypical infection.  He got Z-Pak for this.  Interim History:  Inhalers switched to Trelegy at last visit.  He states that this works much better for him and has reduced the need for albuterol rescue medication. Complains of chronic dyspnea on exertion, cough with white mucus production. Continues to smoke and is planning to quit after the holidays.  Outpatient Encounter Medications as of 05/10/2018  Medication Sig  . clonazePAM (KLONOPIN) 1 MG tablet 1 mg.  . escitalopram (LEXAPRO) 10 MG tablet Take 10 mg by mouth.  . furosemide (LASIX) 20 MG tablet Take 20 mg by mouth daily.  . TRELEGY ELLIPTA 100-62.5-25 MCG/INH AEPB INHALE 1 PUFF INTO THE LUNGS DAILY  . VENTOLIN HFA 108 (90 Base) MCG/ACT inhaler INHALE 2 PUFFS INTO THE LUNGS EVERY 6 HOURS AS NEEDED  . Vitamin D, Ergocalciferol, (DRISDOL) 50000 units CAPS capsule Take 1 capsule by mouth 2 (two) times a week.  Randy Medina 20 MG TABS tablet Take 20 mg by mouth daily.  Marland Kitchen XTAMPZA ER 36 MG C12A Take 2 capsules by mouth 3 (three) times daily.  .  [DISCONTINUED] ALPRAZolam (XANAX) 1 MG tablet Take 1 mg by mouth 3 (three) times daily as needed.  . [DISCONTINUED] predniSONE (DELTASONE) 10 MG tablet 4 tabs x 3 days, 3 tabs x 3 days, 2 tabs x 3 days, 1 tabs x 3 days then stop   No facility-administered encounter medications on file as of 05/10/2018.    Physical Exam: Blood pressure 130/62, pulse 92, height 5' 9.69" (1.77 m), weight 292 lb (132.5 kg), SpO2 93 %. Gen:      No acute distress HEENT:  EOMI, sclera anicteric Neck:     No masses; no thyromegaly Lungs:    Clear to auscultation bilaterally; normal respiratory effort CV:         Regular rate and rhythm; no murmurs Abd:      + bowel sounds; soft, non-tender; no palpable masses, no distension Ext:    No edema; adequate peripheral perfusion Skin:      Warm and dry; no rash Neuro: alert and oriented x 3 Psych: normal mood and affect  Data Reviewed: Imaging Chest x-ray 03/23/17-hyperinflation, no acute abnormality CT chest 05/24/17- groundglass opacities in right upper lobe.  3 mm left upper lobe nodule.  Adrenal nodule.  Aortic atherosclerosis.  I have reviewed the images personally.  PFTs 05/09/17 FVC 3.5 (75%], FEV1 2.38 [68%), F/F 68, TLC 109%.,  RV/TLC 175%, DLCO 95% Moderate obstruction  with bronchodilator response, air trapping  Labs CBC 12/02/2017-WBC 8.9, eos 1.3%, absolute eosinophil count 116 Alpha-1 antitrypsin 11/24/2017-163, PI MM  Assessment:  Follow up for COPD Symptoms improved after Dulera was switched to Trelegy Continue albuterol as needed.  Pulmonary nodule CT scan reviewed with subcentimeter pulmonary nodule Follow-up in 1 year (scheduled for dec 30th)  Active smoker Discussed smoking cessation again.  He had tried nicotine patches, Chantix in the past without success.  He intends to quit after the holidays with nicotine patches.  He does not want to try Chantix again. Time spent counseling-5 minutes.  Reevaluate next visit  Health maintenance Give  flu vaccine today States that he got pneumonia vaccine at urgent care  Plan/Recommendations: - Continue trelegy, albuterol as needed - Smoking cessation - Flu vaccination   Chilton GreathousePraveen Leannah Guse MD [facility] 05/10/2018, 10:34 AM  CC: Creola Cornusso, Osmin, MD

## 2018-05-10 NOTE — Patient Instructions (Signed)
I am glad that the Trelegy is working well for you.  Continue your inhalers as prescribed We gave your flu vaccine today Work on smoking cessation and weight loss which will help with your dyspnea  Follow-up in 6 months with nurse practitioner

## 2018-05-15 ENCOUNTER — Other Ambulatory Visit: Payer: Self-pay | Admitting: Pulmonary Disease

## 2018-05-15 ENCOUNTER — Telehealth: Payer: Self-pay | Admitting: Pulmonary Disease

## 2018-05-15 MED ORDER — VENTOLIN HFA 108 (90 BASE) MCG/ACT IN AERS: INHALATION_SPRAY | RESPIRATORY_TRACT | 5 refills | Status: DC

## 2018-05-15 NOTE — Telephone Encounter (Signed)
Patient returned call, advised [facility]olin has been sent to [facility] [address].  No call back is needed.

## 2018-05-15 NOTE — Telephone Encounter (Signed)
lmtcb for pt. [facility] [address] as requested.

## 2018-05-25 DIAGNOSIS — M542 Cervicalgia: Secondary | ICD-10-CM | POA: Diagnosis not present

## 2018-05-25 DIAGNOSIS — G894 Chronic pain syndrome: Secondary | ICD-10-CM | POA: Diagnosis not present

## 2018-05-25 DIAGNOSIS — M79605 Pain in left leg: Secondary | ICD-10-CM | POA: Diagnosis not present

## 2018-05-25 DIAGNOSIS — M79604 Pain in right leg: Secondary | ICD-10-CM | POA: Diagnosis not present

## 2018-06-05 ENCOUNTER — Inpatient Hospital Stay: Admission: RE | Admit: 2018-06-05 | Payer: BLUE CROSS/BLUE SHIELD | Source: Ambulatory Visit

## 2018-06-09 ENCOUNTER — Ambulatory Visit (INDEPENDENT_AMBULATORY_CARE_PROVIDER_SITE_OTHER)
Admission: RE | Admit: 2018-06-09 | Discharge: 2018-06-09 | Disposition: A | Discharge status: Home / Self Care | Payer: BLUE CROSS/BLUE SHIELD | Source: Ambulatory Visit | Attending: Pulmonary Disease | Admitting: Pulmonary Disease

## 2018-06-09 DIAGNOSIS — R918 Other nonspecific abnormal finding of lung field: Secondary | ICD-10-CM | POA: Diagnosis not present

## 2018-06-13 ENCOUNTER — Other Ambulatory Visit: Payer: Self-pay | Admitting: Pulmonary Disease

## 2018-07-05 ENCOUNTER — Other Ambulatory Visit: Payer: Self-pay | Admitting: Pulmonary Disease

## 2018-07-19 ENCOUNTER — Encounter: Payer: Self-pay | Admitting: *Deleted

## 2018-07-19 NOTE — Progress Notes (Signed)
LMTCB and will go ahead and mail letter

## 2018-07-24 ENCOUNTER — Other Ambulatory Visit: Payer: Self-pay | Admitting: Pulmonary Disease

## 2018-07-24 ENCOUNTER — Telehealth: Payer: Self-pay | Admitting: Pulmonary Disease

## 2018-07-24 DIAGNOSIS — E278 Other specified disorders of adrenal gland: Secondary | ICD-10-CM

## 2018-07-24 DIAGNOSIS — E279 Disorder of adrenal gland, unspecified: Principal | ICD-10-CM

## 2018-07-24 DIAGNOSIS — R911 Solitary pulmonary nodule: Secondary | ICD-10-CM

## 2018-07-24 NOTE — Telephone Encounter (Signed)
Called and spoke with Patient.  Dr Isaiah Serge results and recommendations given.  Understanding stated.  Abd MRI ordered for adrenal nodule.  CT chest with contrast ordered for 1 year to follow up pulmonary nodule.  Nothing further at this time.

## 2018-07-29 ENCOUNTER — Other Ambulatory Visit: Payer: Self-pay | Admitting: Pulmonary Disease

## 2018-07-31 ENCOUNTER — Telehealth: Payer: Self-pay | Admitting: Pulmonary Disease

## 2018-07-31 NOTE — Telephone Encounter (Signed)
ATC pt, no answer. Left message for pt to call back.  

## 2018-08-01 NOTE — Telephone Encounter (Signed)
Pt called back and I transferred him over to central scheduling

## 2018-08-01 NOTE — Telephone Encounter (Signed)
Spoke with pt. He has been made aware of his MRI appointment. Pt was very rude and stated that he can't just do any day, we should have called him before we made the appointment. Message will be routed to [facility] to reschedule.

## 2018-08-01 NOTE — Telephone Encounter (Addendum)
I have left a message for the patient to call me  back if for some reason you cant get me and he needs to resc he can call [phone]  I had called him on 2/18 left message on home phone since his cell # just rings then hung up on me I also called on 07/31/18 left another message then I sent a letter

## 2018-08-03 ENCOUNTER — Ambulatory Visit (HOSPITAL_COMMUNITY): Payer: BLUE CROSS/BLUE SHIELD

## 2018-08-08 ENCOUNTER — Telehealth: Payer: Self-pay | Admitting: Pulmonary Disease

## 2018-08-08 NOTE — Telephone Encounter (Signed)
Spoke with pt and advised him to call [facility] to reschedule MRI. Pt understood and nothing further is needed.    Appointment Information   Appt Date/Time/Location   08/11/2018 11:00 AM  at  [facility] MRI  PreCertification Number (if available)  no auth req

## 2018-08-08 NOTE — Telephone Encounter (Signed)
Randy Medina now has his MRI schedule on 08/21/2018 @ 12:00pm

## 2018-08-08 NOTE — Telephone Encounter (Signed)
I called and had to leave a message for the patient. I gave him Central Schedulings number for him to call and reschedule [phone]. That way the middle man is not involved. I will keep watch to see if he gets it rescheduled. I also told him if he needs anything else to call us back

## 2018-08-11 ENCOUNTER — Ambulatory Visit (HOSPITAL_COMMUNITY): Payer: BLUE CROSS/BLUE SHIELD

## 2018-08-17 DIAGNOSIS — Z79899 Other long term (current) drug therapy: Secondary | ICD-10-CM | POA: Diagnosis not present

## 2018-08-17 DIAGNOSIS — M79605 Pain in left leg: Secondary | ICD-10-CM | POA: Diagnosis not present

## 2018-08-17 DIAGNOSIS — Z5181 Encounter for therapeutic drug level monitoring: Secondary | ICD-10-CM | POA: Diagnosis not present

## 2018-08-17 DIAGNOSIS — M542 Cervicalgia: Secondary | ICD-10-CM | POA: Diagnosis not present

## 2018-08-17 DIAGNOSIS — G894 Chronic pain syndrome: Secondary | ICD-10-CM | POA: Diagnosis not present

## 2018-08-17 DIAGNOSIS — M79604 Pain in right leg: Secondary | ICD-10-CM | POA: Diagnosis not present

## 2018-08-21 ENCOUNTER — Ambulatory Visit (HOSPITAL_COMMUNITY): Admission: RE | Admit: 2018-08-21 | Payer: BLUE CROSS/BLUE SHIELD | Source: Ambulatory Visit

## 2018-08-25 ENCOUNTER — Other Ambulatory Visit: Payer: Self-pay | Admitting: *Deleted

## 2018-08-25 MED ORDER — FLUTICASONE-UMECLIDIN-VILANT 100-62.5-25 MCG/INH IN AEPB: 1.0000 | INHALATION_SPRAY | Freq: Every day | RESPIRATORY_TRACT | 5 refills | Status: DC

## 2018-09-01 ENCOUNTER — Ambulatory Visit (HOSPITAL_COMMUNITY): Admission: RE | Admit: 2018-09-01 | Payer: BLUE CROSS/BLUE SHIELD | Source: Ambulatory Visit

## 2018-09-20 ENCOUNTER — Telehealth: Payer: Self-pay | Admitting: Pulmonary Disease

## 2018-09-20 NOTE — Telephone Encounter (Signed)
Spoke with Morrie Sheldon with radiology and notified of Brian's response  She verbalized understanding  Will forward to Dr Isaiah Serge

## 2018-09-20 NOTE — Telephone Encounter (Signed)
Unfortunately, I do not believe this can be pushed out.  The patient has already delayed based off of his own scheduling to get this completed for over 2 months now.  I believe the patient needs to proceed forward with getting this completed as scheduled.    We will route to Dr. Isaiah Serge as Lorain Childes.  If Dr. Isaiah Serge has a different opinion he can respond back to this telephone note.  Elisha Headland, FNP

## 2018-09-20 NOTE — Telephone Encounter (Signed)
Called and spoke to [place] with radiology stating to her that the MRI does not need to be pushed out and it needs to be done ASAP for pt. Randy Medina expressed understanding and stated she would leave it on as scheduled. Nothing further needed.

## 2018-09-20 NOTE — Telephone Encounter (Signed)
Spoke with Randy Medina with [facility] radiology  She is asking if MRI Abd can be pushed out further  Looks like Randy Medina had CT Chest done for eval pulm nodule 06/09/2018 and was found to have a 2.2 cm adrenal nodule and this is why this was ordered  Randy Medina has already rescheduled this a few times now  Currently it is scheduled for 09/27/2018  Arlys Makari please advise thanks

## 2018-09-20 NOTE — Telephone Encounter (Signed)
Please see PM response. Pt needs to proceed forward as scheduled.   Randy Medina

## 2018-09-20 NOTE — Telephone Encounter (Signed)
Agree. Will need to get MRI done

## 2018-09-27 ENCOUNTER — Ambulatory Visit (HOSPITAL_COMMUNITY): Admission: RE | Admit: 2018-09-27 | Payer: BLUE CROSS/BLUE SHIELD | Source: Ambulatory Visit

## 2018-10-06 ENCOUNTER — Other Ambulatory Visit: Payer: Self-pay | Admitting: Pulmonary Disease

## 2018-11-28 DIAGNOSIS — Z79899 Other long term (current) drug therapy: Secondary | ICD-10-CM | POA: Diagnosis not present

## 2018-11-28 DIAGNOSIS — Z5181 Encounter for therapeutic drug level monitoring: Secondary | ICD-10-CM | POA: Diagnosis not present

## 2019-01-22 ENCOUNTER — Telehealth: Payer: Self-pay | Admitting: Pulmonary Disease

## 2019-01-22 DIAGNOSIS — J449 Chronic obstructive pulmonary disease, unspecified: Secondary | ICD-10-CM

## 2019-01-22 DIAGNOSIS — R0602 Shortness of breath: Secondary | ICD-10-CM

## 2019-01-22 MED ORDER — TRELEGY ELLIPTA 100-62.5-25 MCG/INH IN AEPB: 1.0000 | INHALATION_SPRAY | Freq: Every day | RESPIRATORY_TRACT | 3 refills | Status: DC

## 2019-01-22 MED ORDER — VENTOLIN HFA 108 (90 BASE) MCG/ACT IN AERS: INHALATION_SPRAY | RESPIRATORY_TRACT | 3 refills | Status: DC

## 2019-01-22 NOTE — Telephone Encounter (Signed)
This pt has called and left 3 voicemails on my phone about his medicine [phone] be sure and leave him the call back number thanks

## 2019-01-22 NOTE — Telephone Encounter (Signed)
LMTCB. Patient is also due for his 6 month f/u in office.

## 2019-01-22 NOTE — Telephone Encounter (Signed)
Called the patient and confirmed prescription to be sent to the [facility] on [address]. Patient aware follow up appointment will need to be made and he requested our office call him back (sounded like he was outside). Called patient on [phone].  Prescription sent, recall made with NP per Dr. Matilde Bash office note. Nothing further needed at this time.

## 2019-01-22 NOTE — Telephone Encounter (Signed)
Randy Medina from [facility] called - pt calling them about refill of Trelegy - she can be reached at [phone]

## 2019-02-14 ENCOUNTER — Ambulatory Visit: Payer: BLUE CROSS/BLUE SHIELD | Admitting: Pulmonary Disease

## 2019-02-21 ENCOUNTER — Ambulatory Visit (INDEPENDENT_AMBULATORY_CARE_PROVIDER_SITE_OTHER): Payer: BC Managed Care – PPO | Admitting: Pulmonary Disease

## 2019-02-21 ENCOUNTER — Other Ambulatory Visit: Payer: Self-pay

## 2019-02-21 ENCOUNTER — Encounter: Payer: Self-pay | Admitting: Pulmonary Disease

## 2019-02-21 VITALS — BP 124/76 | HR 84 | Temp 96.3°F | Ht 70.0 in | Wt 298.4 lb

## 2019-02-21 DIAGNOSIS — F17210 Nicotine dependence, cigarettes, uncomplicated: Secondary | ICD-10-CM

## 2019-02-21 DIAGNOSIS — E278 Other specified disorders of adrenal gland: Secondary | ICD-10-CM

## 2019-02-21 DIAGNOSIS — E279 Disorder of adrenal gland, unspecified: Secondary | ICD-10-CM

## 2019-02-21 DIAGNOSIS — R0602 Shortness of breath: Secondary | ICD-10-CM | POA: Diagnosis not present

## 2019-02-21 DIAGNOSIS — Z23 Encounter for immunization: Secondary | ICD-10-CM | POA: Diagnosis not present

## 2019-02-21 DIAGNOSIS — J449 Chronic obstructive pulmonary disease, unspecified: Secondary | ICD-10-CM | POA: Diagnosis not present

## 2019-02-21 MED ORDER — VENTOLIN HFA 108 (90 BASE) MCG/ACT IN AERS: INHALATION_SPRAY | RESPIRATORY_TRACT | 3 refills | Status: DC

## 2019-02-21 NOTE — Patient Instructions (Signed)
We will give you a flu vaccine today We will reschedule the MRI of the abdomen to evaluate the adrenal nodule Continue the Trelegy and work on smoking cessation  Follow-up in 6 months

## 2019-02-21 NOTE — Progress Notes (Signed)
Randy GageJohn Medina    [id number]    [birth year]  Primary Care Physician:Russo, Jonny RuizJohn, MD  Referring Physician: Creola Cornusso, Shadi, MD [address] [place] [id number]  Chief complaint:   Follow up for COPD, dyspnea  HPI: Randy Medina is a 63 year old with heavy smoking history, active smoker, chronic back pain on opiate medication.  Referred for evaluation of COPD.  [place] live at [place] and recently moved to [place] area in 2017 for his wife's job reason. He still [place] time between [place] and [place]. He has chronic pain issues, back pain. His pain doctor is still in [place] and he travels there to get his pain medications from there. As per PCP note he has H/O PE DVT. He saw a hematologist in [place] and was told he had a mutation and will need to be on life long anticoagulation.  Had a CT scan in December 19 which showed mild inflammation suggestive of atypical infection.  He got Z-Pak for this. [facility].  Symptoms are better with Trelegy inhaler  Interim History:  Continues on Trelegy inhaler.  States that it is working well for him Still smoking 1 to 2 cigarettes a day but is trying to cut down with nicotine lozenges  He did not get MRI in March which was ordered for evaluation of adrenal nodule as he was afraid of contracting COVID. He is ready to follow through now  Outpatient Encounter Medications as of 02/21/2019  Medication Sig  . clonazePAM (KLONOPIN) 1 MG tablet 1 mg.  . escitalopram (LEXAPRO) 10 MG tablet Take 10 mg by mouth.  . escitalopram (LEXAPRO) 5 MG tablet Take 5 mg by mouth daily.   . Fluticasone-Umeclidin-Vilant (TRELEGY ELLIPTA) 100-62.5-25 MCG/INH AEPB Inhale 1 puff into the lungs daily.  . furosemide (LASIX) 20 MG tablet Take 20 mg by mouth daily.  . VENTOLIN HFA 108 (90 Base) MCG/ACT inhaler INHALE 2 PUFFS INTO THE LUNG EVERY 6 HOURS AS NEEDED  . Vitamin D, Ergocalciferol, (DRISDOL) 50000 units  CAPS capsule Take 1 capsule by mouth 2 (two) times a week.  Randy Medina. XARELTO 20 MG TABS tablet Take 20 mg by mouth daily.  Marland Kitchen. XTAMPZA ER 36 MG C12A Take 2 capsules by mouth 3 (three) times daily.   No facility-administered encounter medications on file as of 02/21/2019.    Physical Exam: Blood pressure 124/76, pulse 84, temperature (!) 96.3 F (35.7 C), temperature source Temporal, height 5\' 10"  (1.778 m), weight 298 lb 6.4 oz (135.4 kg), SpO2 95 %. Gen:      No acute distress HEENT:  EOMI, sclera anicteric Neck:     No masses; no thyromegaly Lungs:    Clear to auscultation bilaterally; normal respiratory effort CV:         Regular rate and rhythm; no murmurs Abd:      + bowel sounds; soft, non-tender; no palpable masses, no distension Ext:    No edema; adequate peripheral perfusion Skin:      Warm and dry; no rash Neuro: alert and oriented x 3 Psych: normal mood and affect  Data Reviewed: Imaging Chest x-ray 03/23/17-hyperinflation, no acute abnormality CT chest 05/24/17- groundglass opacities in right upper lobe.  3 mm left upper lobe nodule.  Adrenal nodule.  Aortic atherosclerosis.  I have reviewed the images personally.  PFTs 05/09/17 FVC 3.5 (75%], FEV1 2.38 [68%), F/F 68, TLC 109%.,  RV/TLC 175%,  DLCO 95% Moderate obstruction with bronchodilator response, air trapping  Labs CBC 12/02/2017-WBC 8.9, eos 1.3%, absolute eosinophil count 116 Alpha-1 antitrypsin 11/24/2017-163, PI MM  Assessment:  Follow up for COPD Continue Trelegy inhaler, albuterol as needed  Pulmonary nodule CT scan reviewed with subcentimeter pulmonary nodule Follow-up in 1 year   Adrenal nodule We had scheduled MRI in March but patient did not follow through Reschedule MRI  Active smoker Discussed smoking cessation again.  He is trying to quit with nicotine lozenges Does not want to try the Chantix. Time spent counseling-5 minutes.  Reevaluate next visit  Health maintenance Give flu vaccine today States  that he got pneumonia vaccine at urgent care  Plan/Recommendations: - Continue trelegy, albuterol as needed - Reschedule MRI - Smoking cessation - Flu vaccination   Randy Garfinkel MD [facility] 02/21/2019, 12:23 PM  CC: Randy [place], MD

## 2019-02-27 DIAGNOSIS — G8929 Other chronic pain: Secondary | ICD-10-CM | POA: Diagnosis not present

## 2019-02-27 DIAGNOSIS — G894 Chronic pain syndrome: Secondary | ICD-10-CM | POA: Diagnosis not present

## 2019-02-27 DIAGNOSIS — M25562 Pain in left knee: Secondary | ICD-10-CM | POA: Diagnosis not present

## 2019-02-27 DIAGNOSIS — M545 Low back pain: Secondary | ICD-10-CM | POA: Diagnosis not present

## 2019-05-10 ENCOUNTER — Other Ambulatory Visit: Payer: Self-pay

## 2019-05-10 DIAGNOSIS — J449 Chronic obstructive pulmonary disease, unspecified: Secondary | ICD-10-CM

## 2019-05-10 DIAGNOSIS — R0602 Shortness of breath: Secondary | ICD-10-CM

## 2019-05-10 MED ORDER — TRELEGY ELLIPTA 100-62.5-25 MCG/INH IN AEPB: 1.0000 | INHALATION_SPRAY | Freq: Every day | RESPIRATORY_TRACT | 3 refills | Status: DC

## 2019-05-24 DIAGNOSIS — M545 Low back pain: Secondary | ICD-10-CM | POA: Diagnosis not present

## 2019-05-24 DIAGNOSIS — G894 Chronic pain syndrome: Secondary | ICD-10-CM | POA: Diagnosis not present

## 2019-05-24 DIAGNOSIS — M25562 Pain in left knee: Secondary | ICD-10-CM | POA: Diagnosis not present

## 2019-05-24 DIAGNOSIS — M79605 Pain in left leg: Secondary | ICD-10-CM | POA: Diagnosis not present

## 2019-05-24 DIAGNOSIS — M79604 Pain in right leg: Secondary | ICD-10-CM | POA: Diagnosis not present

## 2019-06-21 DIAGNOSIS — R7989 Other specified abnormal findings of blood chemistry: Secondary | ICD-10-CM | POA: Diagnosis not present

## 2019-06-21 DIAGNOSIS — R351 Nocturia: Secondary | ICD-10-CM | POA: Diagnosis not present

## 2019-06-21 DIAGNOSIS — R5383 Other fatigue: Secondary | ICD-10-CM | POA: Diagnosis not present

## 2019-06-21 DIAGNOSIS — Z79899 Other long term (current) drug therapy: Secondary | ICD-10-CM | POA: Diagnosis not present

## 2019-06-21 DIAGNOSIS — E559 Vitamin D deficiency, unspecified: Secondary | ICD-10-CM | POA: Diagnosis not present

## 2019-07-01 IMAGING — DX DG CHEST 2V
2 series · 2 of 2 positions shown · non-contrast
Comparison: None.

CLINICAL DATA: Chronic shortness of Breath

EXAM:
CHEST  2 VIEW

[chest pa]
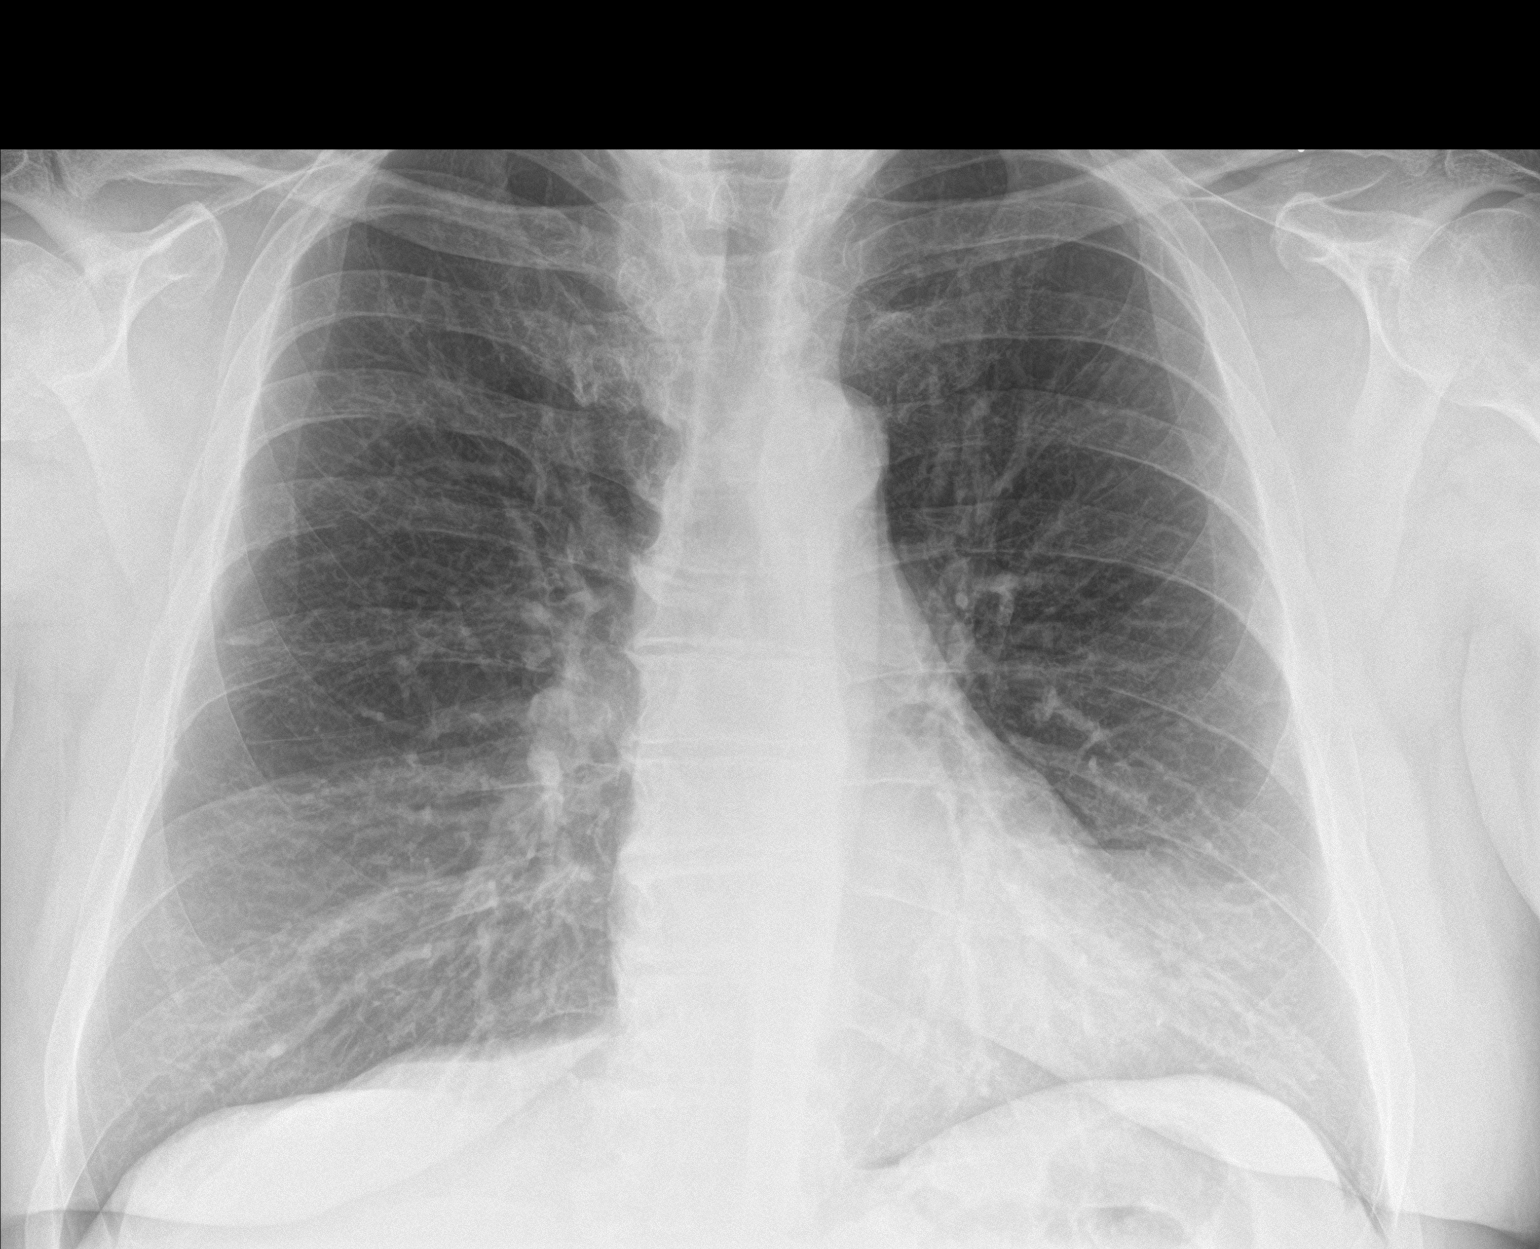

[chest lat]
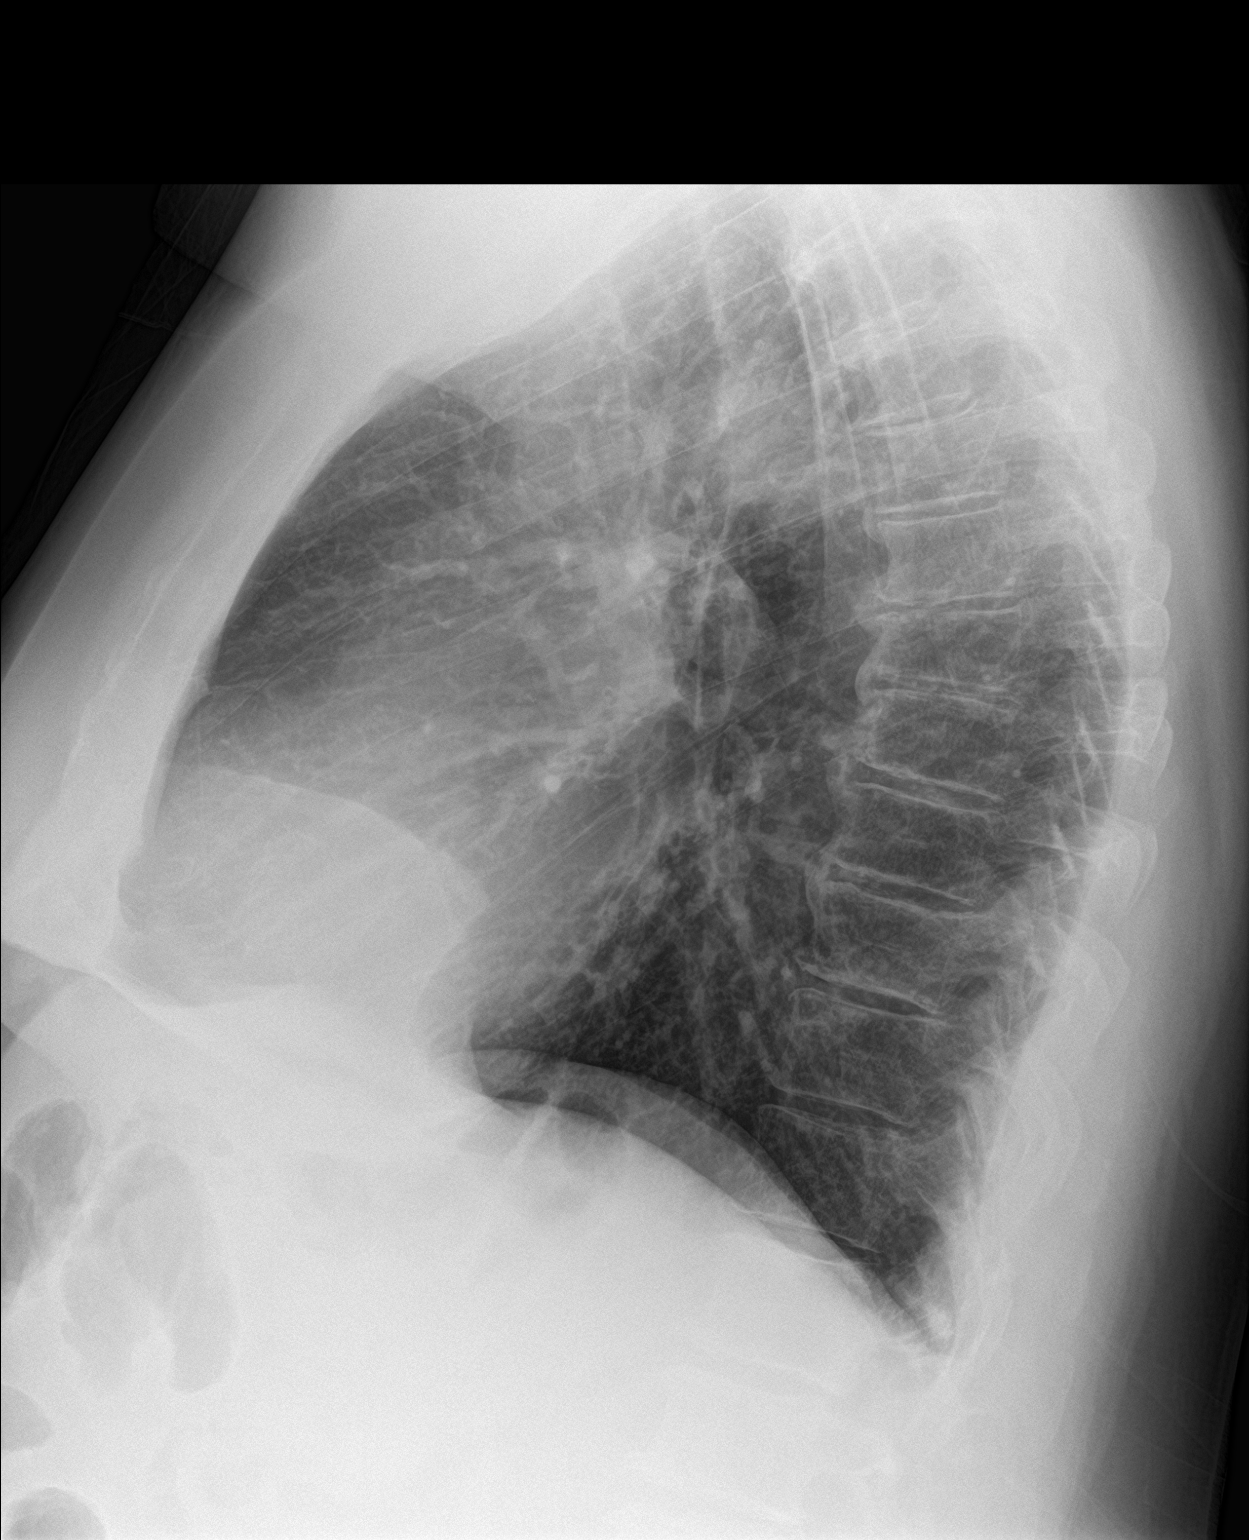

[2 of 2 positions shown; findings below may reference images not displayed]

FINDINGS: Cardiac shadow is within normal limits. Prominent epicardial fat pad
is noted. No focal infiltrate or sizable effusion is seen. No acute
bony abnormality is noted.
IMPRESSION: No active cardiopulmonary disease.

## 2019-07-05 DIAGNOSIS — Z1211 Encounter for screening for malignant neoplasm of colon: Secondary | ICD-10-CM | POA: Diagnosis not present

## 2019-07-05 DIAGNOSIS — Z1212 Encounter for screening for malignant neoplasm of rectum: Secondary | ICD-10-CM | POA: Diagnosis not present

## 2019-07-06 ENCOUNTER — Telehealth: Payer: Self-pay | Admitting: Pulmonary Disease

## 2019-07-06 DIAGNOSIS — R0602 Shortness of breath: Secondary | ICD-10-CM

## 2019-07-06 DIAGNOSIS — J449 Chronic obstructive pulmonary disease, unspecified: Secondary | ICD-10-CM

## 2019-07-06 MED ORDER — VENTOLIN HFA 108 (90 BASE) MCG/ACT IN AERS: INHALATION_SPRAY | RESPIRATORY_TRACT | 3 refills | Status: DC

## 2019-07-06 NOTE — Telephone Encounter (Signed)
Refill of pt's ventolin inhaler has been sent to preferred pharmacy for pt. Attempted to call pt to let him know this had been done but unable to reach. I have left a detailed message on pt's machine that this was taken care of for him. Nothing further needed.

## 2019-07-20 ENCOUNTER — Other Ambulatory Visit: Payer: Self-pay | Admitting: Pulmonary Disease

## 2019-07-24 ENCOUNTER — Other Ambulatory Visit: Payer: Self-pay | Admitting: Pulmonary Disease

## 2019-07-24 DIAGNOSIS — R911 Solitary pulmonary nodule: Secondary | ICD-10-CM

## 2019-07-25 ENCOUNTER — Other Ambulatory Visit: Payer: BC Managed Care – PPO

## 2019-08-06 ENCOUNTER — Other Ambulatory Visit: Payer: Self-pay

## 2019-08-06 ENCOUNTER — Ambulatory Visit
Admission: RE | Admit: 2019-08-06 | Discharge: 2019-08-06 | Disposition: A | Discharge status: Home / Self Care | Payer: Medicare Other | Source: Ambulatory Visit | Attending: Pulmonary Disease | Admitting: Pulmonary Disease

## 2019-08-06 DIAGNOSIS — R911 Solitary pulmonary nodule: Secondary | ICD-10-CM

## 2019-08-06 MED ORDER — IOPAMIDOL (ISOVUE-300) INJECTION 61%
75.0000 mL | Freq: Once | INTRAVENOUS | Status: AC | PRN
  Administered 2019-08-06: 13:00:00 75 mL via INTRAVENOUS

## 2019-08-07 ENCOUNTER — Telehealth: Payer: Self-pay | Admitting: Pulmonary Disease

## 2019-08-07 NOTE — Telephone Encounter (Signed)
Advised pt of results. Pt understood and nothing further is needed.   Chilton Greathouse, MD  08/07/2019 1:20 PM EST    The lung nodules are smaller or stable which is good news  Please schedule for annual screening low dose chest CT to start in march 2022

## 2019-08-15 DIAGNOSIS — Z5181 Encounter for therapeutic drug level monitoring: Secondary | ICD-10-CM | POA: Diagnosis not present

## 2019-08-15 DIAGNOSIS — Z79899 Other long term (current) drug therapy: Secondary | ICD-10-CM | POA: Diagnosis not present

## 2019-08-15 DIAGNOSIS — G894 Chronic pain syndrome: Secondary | ICD-10-CM | POA: Diagnosis not present

## 2019-09-25 DIAGNOSIS — H25013 Cortical age-related cataract, bilateral: Secondary | ICD-10-CM | POA: Diagnosis not present

## 2019-09-25 DIAGNOSIS — H524 Presbyopia: Secondary | ICD-10-CM | POA: Diagnosis not present

## 2019-09-25 DIAGNOSIS — H35013 Changes in retinal vascular appearance, bilateral: Secondary | ICD-10-CM | POA: Diagnosis not present

## 2019-09-25 DIAGNOSIS — H2513 Age-related nuclear cataract, bilateral: Secondary | ICD-10-CM | POA: Diagnosis not present

## 2019-09-25 DIAGNOSIS — H43393 Other vitreous opacities, bilateral: Secondary | ICD-10-CM | POA: Diagnosis not present

## 2019-10-08 ENCOUNTER — Telehealth: Payer: Self-pay | Admitting: Pulmonary Disease

## 2019-10-08 DIAGNOSIS — R0602 Shortness of breath: Secondary | ICD-10-CM

## 2019-10-08 DIAGNOSIS — J449 Chronic obstructive pulmonary disease, unspecified: Secondary | ICD-10-CM

## 2019-10-08 MED ORDER — VENTOLIN HFA 108 (90 BASE) MCG/ACT IN AERS: INHALATION_SPRAY | RESPIRATORY_TRACT | 3 refills | Status: DC

## 2019-10-08 NOTE — Telephone Encounter (Signed)
Ventolin rx sent to [address] Patient is aware

## 2019-10-08 NOTE — Telephone Encounter (Signed)
Pt called back-- wants [facility] at [address].

## 2019-10-08 NOTE — Telephone Encounter (Signed)
Randy Medina called the other day and we made an appt to see Dr. Isaiah Serge on 05/11. He thought he had enough Ventolin to make do until he could be seen and get his refills.  He called back into the office today. He has been using the medication more often lately and really needs a refill. Can someone help him with a refill for Ventolin

## 2019-10-08 NOTE — Telephone Encounter (Signed)
lmtcb for pt. We can refill albuterol but we need to verify which pharmacy he wants this filled to- 2 local pharmacies on file.   Will await call back.

## 2019-10-16 ENCOUNTER — Telehealth: Payer: Self-pay | Admitting: Pulmonary Disease

## 2019-10-16 ENCOUNTER — Ambulatory Visit: Payer: BC Managed Care – PPO | Admitting: Pulmonary Disease

## 2019-10-16 DIAGNOSIS — R0602 Shortness of breath: Secondary | ICD-10-CM

## 2019-10-16 DIAGNOSIS — J449 Chronic obstructive pulmonary disease, unspecified: Secondary | ICD-10-CM

## 2019-10-16 MED ORDER — TRELEGY ELLIPTA 100-62.5-25 MCG/INH IN AEPB: 1.0000 | INHALATION_SPRAY | Freq: Every day | RESPIRATORY_TRACT | 0 refills | Status: DC

## 2019-10-16 NOTE — Telephone Encounter (Signed)
Spoke with pt and advised rx sent to pharmacy. Nothing further is needed.   

## 2019-10-19 ENCOUNTER — Encounter: Payer: Self-pay | Admitting: Pulmonary Disease

## 2019-10-19 ENCOUNTER — Other Ambulatory Visit: Payer: Self-pay

## 2019-10-19 ENCOUNTER — Ambulatory Visit (INDEPENDENT_AMBULATORY_CARE_PROVIDER_SITE_OTHER): Payer: BC Managed Care – PPO | Admitting: Pulmonary Disease

## 2019-10-19 VITALS — BP 118/64 | HR 78 | Temp 98.3°F | Ht 70.0 in | Wt 292.2 lb

## 2019-10-19 DIAGNOSIS — Z87891 Personal history of nicotine dependence: Secondary | ICD-10-CM

## 2019-10-19 DIAGNOSIS — R0602 Shortness of breath: Secondary | ICD-10-CM

## 2019-10-19 DIAGNOSIS — E278 Other specified disorders of adrenal gland: Secondary | ICD-10-CM | POA: Diagnosis not present

## 2019-10-19 DIAGNOSIS — R911 Solitary pulmonary nodule: Secondary | ICD-10-CM | POA: Diagnosis not present

## 2019-10-19 DIAGNOSIS — E279 Disorder of adrenal gland, unspecified: Secondary | ICD-10-CM

## 2019-10-19 NOTE — Progress Notes (Signed)
Randy Medina    [id number]    [birth year]  Primary Care Physician:Russo, Jonny Ruiz, MD  Referring Physician: Creola Corn, MD [address] [place],  [place] [address]  Chief complaint:   Follow up for COPD, dyspnea  HPI: Mr. Arrighi is a 64 year old with heavy smoking history, active smoker, chronic back pain on opiate medication.  Referred for evaluation of COPD.  He used to live at [place], [place] and recently moved to [place] area in 2017 for his wife's job reason. He still spends his time between [place] and [place]. He has chronic pain issues, back pain. His pain doctor is still in [place] and he travels there to get his pain medications from there. As per PCP note he has H/O PE DVT. He saw a hematologist in [place] and was told he had a mutation and will need to be on life long anticoagulation.  Had a CT scan in December 19 which showed mild inflammation suggestive of atypical infection.  He got Z-Pak for this. Switch from [facility] to Trelegy in 2019.  Symptoms are better with Trelegy inhaler  Interim History:  Continues on Trelegy inhaler.  States that it is working well for him Still smoking 1 to 2 cigarettes a day but is trying to cut down with nicotine lozenges  He did not get MRI in March which was ordered for evaluation of adrenal nodule as he was afraid of contracting COVID. He is ready to follow through now  Outpatient Encounter Medications as of 10/19/2019  Medication Sig  . clonazePAM (KLONOPIN) 1 MG tablet 1 mg.  . escitalopram (LEXAPRO) 10 MG tablet Take 10 mg by mouth.  . escitalopram (LEXAPRO) 5 MG tablet Take 5 mg by mouth daily.   . Fluticasone-Umeclidin-Vilant (TRELEGY ELLIPTA) 100-62.5-25 MCG/INH AEPB Inhale 1 puff into the lungs daily.  . VENTOLIN HFA 108 (90 Base) MCG/ACT inhaler INHALE 2 PUFFS INTO THE LUNG EVERY 6 HOURS AS NEEDED  . Vitamin D, Ergocalciferol, (DRISDOL) 50000 units CAPS capsule Take 1 capsule by mouth 2 (two) times a week.  Carlena Hurl 20 MG TABS tablet Take 20 mg by mouth daily.  Marland Kitchen XTAMPZA ER 36 MG C12A Take 2 capsules by mouth 3 (three) times daily.  . [DISCONTINUED] furosemide (LASIX) 20 MG tablet Take 20 mg by mouth daily.  . traZODone (DESYREL) 100 MG tablet Take 200 mg by mouth at bedtime as needed.   No facility-administered encounter medications on file as of 10/19/2019.   Physical Exam: Blood pressure 124/76, pulse 84, temperature (!) 96.3 F (35.7 C), temperature source Temporal, height 5\' 10"  (1.778 m), weight 298 lb 6.4 oz (135.4 kg), SpO2 95 %. Gen:      No acute distress HEENT:  EOMI, sclera anicteric Neck:     No masses; no thyromegaly Lungs:    Clear to auscultation bilaterally; normal respiratory effort CV:         Regular rate and rhythm; no murmurs Abd:      + bowel sounds; soft, non-tender; no palpable masses, no distension Ext:    No edema; adequate peripheral perfusion Skin:      Warm and dry; no rash Neuro: alert and oriented x 3 Psych: normal mood and affect  Data Reviewed: Imaging CT chest 05/24/17- groundglass opacities in right upper lobe.  3 mm left upper lobe nodule.  Adrenal nodule.  Aortic atherosclerosis.  CT chest 06/09/2018-mild groundglass opacities with bronchiectasis in the right upper lobe,  stable lung nodules.  2.2 cm adrenal nodule  CT chest 08/06/2019-diminished or stable lung nodules, bronchial wall thickening. I reviewed the images personally  PFTs 05/09/17 FVC 3.5 (75%], FEV1 2.38 [68%), F/F 68, TLC 109%.,  RV/TLC 175%, DLCO 95% Moderate obstruction with bronchodilator response, air trapping  Labs CBC 12/02/2017-WBC 8.9, eos 1.3%, absolute eosinophil count 116 Alpha-1 antitrypsin 11/24/2017-163, PI MM  Assessment:  Follow up for COPD Continue Trelegy inhaler, albuterol as needed  Pulmonary nodule CT scan reviewed with subcentimeter pulmonary nodule Follow-up in 1 year   Adrenal nodule We had scheduled MRI in March but patient did not follow through Reschedule  MRI  Active smoker Discussed smoking cessation again.  He is trying to quit with nicotine lozenges Does not want to try the Chantix. Time spent counseling-5 minutes.  Reevaluate next visit  Health maintenance States that he got pneumonia vaccine at urgent care  Plan/Recommendations: - Continue trelegy, albuterol as needed - Reschedule MRI - Smoking cessation - Flu vaccination   Marshell Garfinkel MD [facility] 10/19/2019, 3:53 PM  CC: Shon Baton, MD

## 2019-10-19 NOTE — Patient Instructions (Signed)
His CT scan of the chest shows small lung nodules which is good news We will continue annual CTs of the chest every year to keep a watch Continue the Trelegy and work on smoking cessation  Your previous CTs of the chest had shown nodule in the adrenals that we would like to investigate further For this we will order an MRI of the abdomen for better evaluation  Follow-up in 1 year.

## 2019-11-06 ENCOUNTER — Ambulatory Visit (HOSPITAL_COMMUNITY): Payer: Medicare Other

## 2019-11-12 ENCOUNTER — Other Ambulatory Visit: Payer: Self-pay | Admitting: *Deleted

## 2019-11-12 DIAGNOSIS — J449 Chronic obstructive pulmonary disease, unspecified: Secondary | ICD-10-CM

## 2019-11-12 DIAGNOSIS — R0602 Shortness of breath: Secondary | ICD-10-CM

## 2019-11-12 MED ORDER — TRELEGY ELLIPTA 100-62.5-25 MCG/INH IN AEPB: 1.0000 | INHALATION_SPRAY | Freq: Every day | RESPIRATORY_TRACT | 5 refills | Status: DC

## 2019-11-16 ENCOUNTER — Other Ambulatory Visit: Payer: Self-pay | Admitting: *Deleted

## 2019-11-20 ENCOUNTER — Ambulatory Visit (HOSPITAL_COMMUNITY): Admission: RE | Admit: 2019-11-20 | Payer: Medicare Other | Source: Ambulatory Visit

## 2019-11-20 DIAGNOSIS — M79604 Pain in right leg: Secondary | ICD-10-CM | POA: Diagnosis not present

## 2019-11-20 DIAGNOSIS — M542 Cervicalgia: Secondary | ICD-10-CM | POA: Diagnosis not present

## 2019-11-20 DIAGNOSIS — M545 Low back pain: Secondary | ICD-10-CM | POA: Diagnosis not present

## 2019-11-20 DIAGNOSIS — G894 Chronic pain syndrome: Secondary | ICD-10-CM | POA: Diagnosis not present

## 2019-12-03 ENCOUNTER — Telehealth: Payer: Self-pay | Admitting: Pulmonary Disease

## 2019-12-03 NOTE — Telephone Encounter (Signed)
Patient called the office on my phone. He stated that he did get his refill of his Trelegy but the Rx was sent to [facility] on [address]. He states that he uses Therapist, occupational at [facility]. He wanted to make sure the information in the chart was correct because he will be needing another refill shortly

## 2019-12-03 NOTE — Telephone Encounter (Signed)
Preferred pharmacy has been updated.

## 2020-01-23 ENCOUNTER — Other Ambulatory Visit: Payer: Self-pay | Admitting: Pulmonary Disease

## 2020-01-23 DIAGNOSIS — R0602 Shortness of breath: Secondary | ICD-10-CM

## 2020-01-23 DIAGNOSIS — J449 Chronic obstructive pulmonary disease, unspecified: Secondary | ICD-10-CM

## 2020-01-24 ENCOUNTER — Other Ambulatory Visit: Payer: Self-pay | Admitting: Adult Health

## 2020-01-24 DIAGNOSIS — J449 Chronic obstructive pulmonary disease, unspecified: Secondary | ICD-10-CM

## 2020-01-24 DIAGNOSIS — R0602 Shortness of breath: Secondary | ICD-10-CM

## 2020-03-06 DIAGNOSIS — G894 Chronic pain syndrome: Secondary | ICD-10-CM | POA: Diagnosis not present

## 2020-03-06 DIAGNOSIS — M79604 Pain in right leg: Secondary | ICD-10-CM | POA: Diagnosis not present

## 2020-03-06 DIAGNOSIS — M542 Cervicalgia: Secondary | ICD-10-CM | POA: Diagnosis not present

## 2020-03-06 DIAGNOSIS — M545 Low back pain: Secondary | ICD-10-CM | POA: Diagnosis not present

## 2020-03-11 IMAGING — DX DG CHEST 2V
2 series · 2 of 2 positions shown · non-contrast
Comparison: 05/24/2017

CLINICAL DATA: Chronic wheezing

EXAM:
CHEST - 2 VIEW

[chest pa]
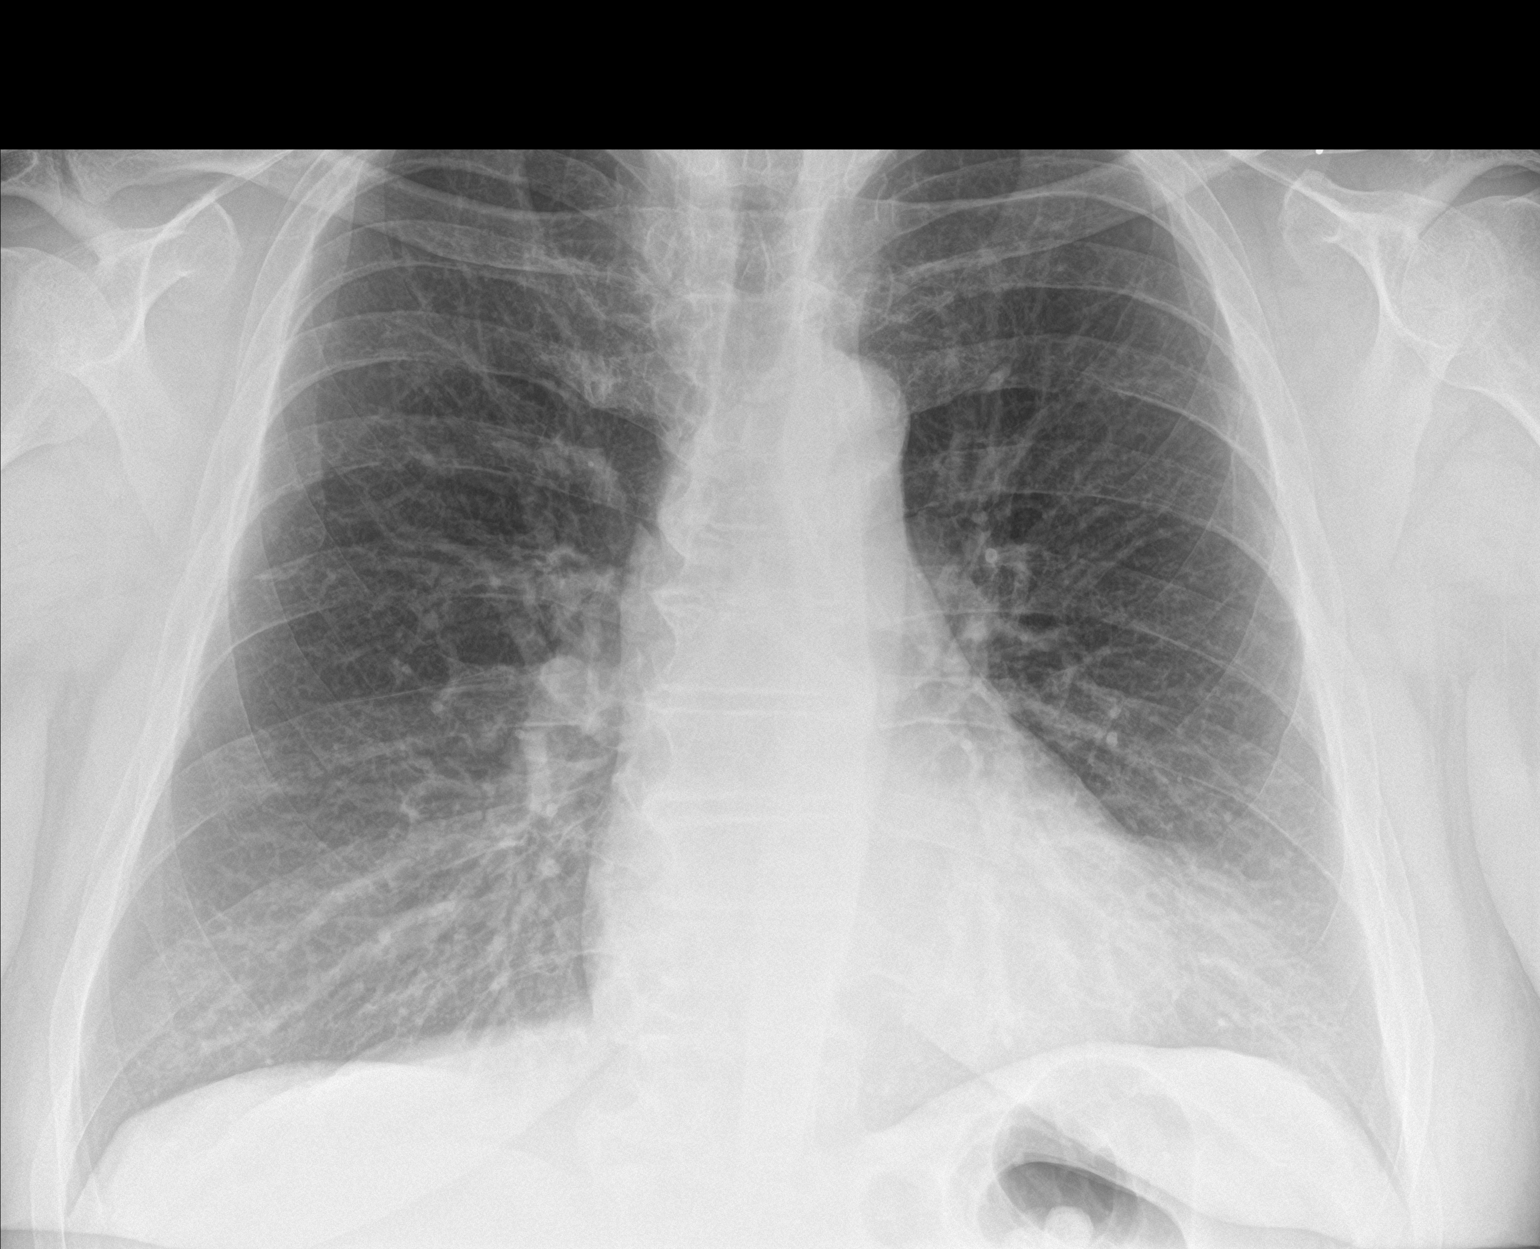

[chest lat]
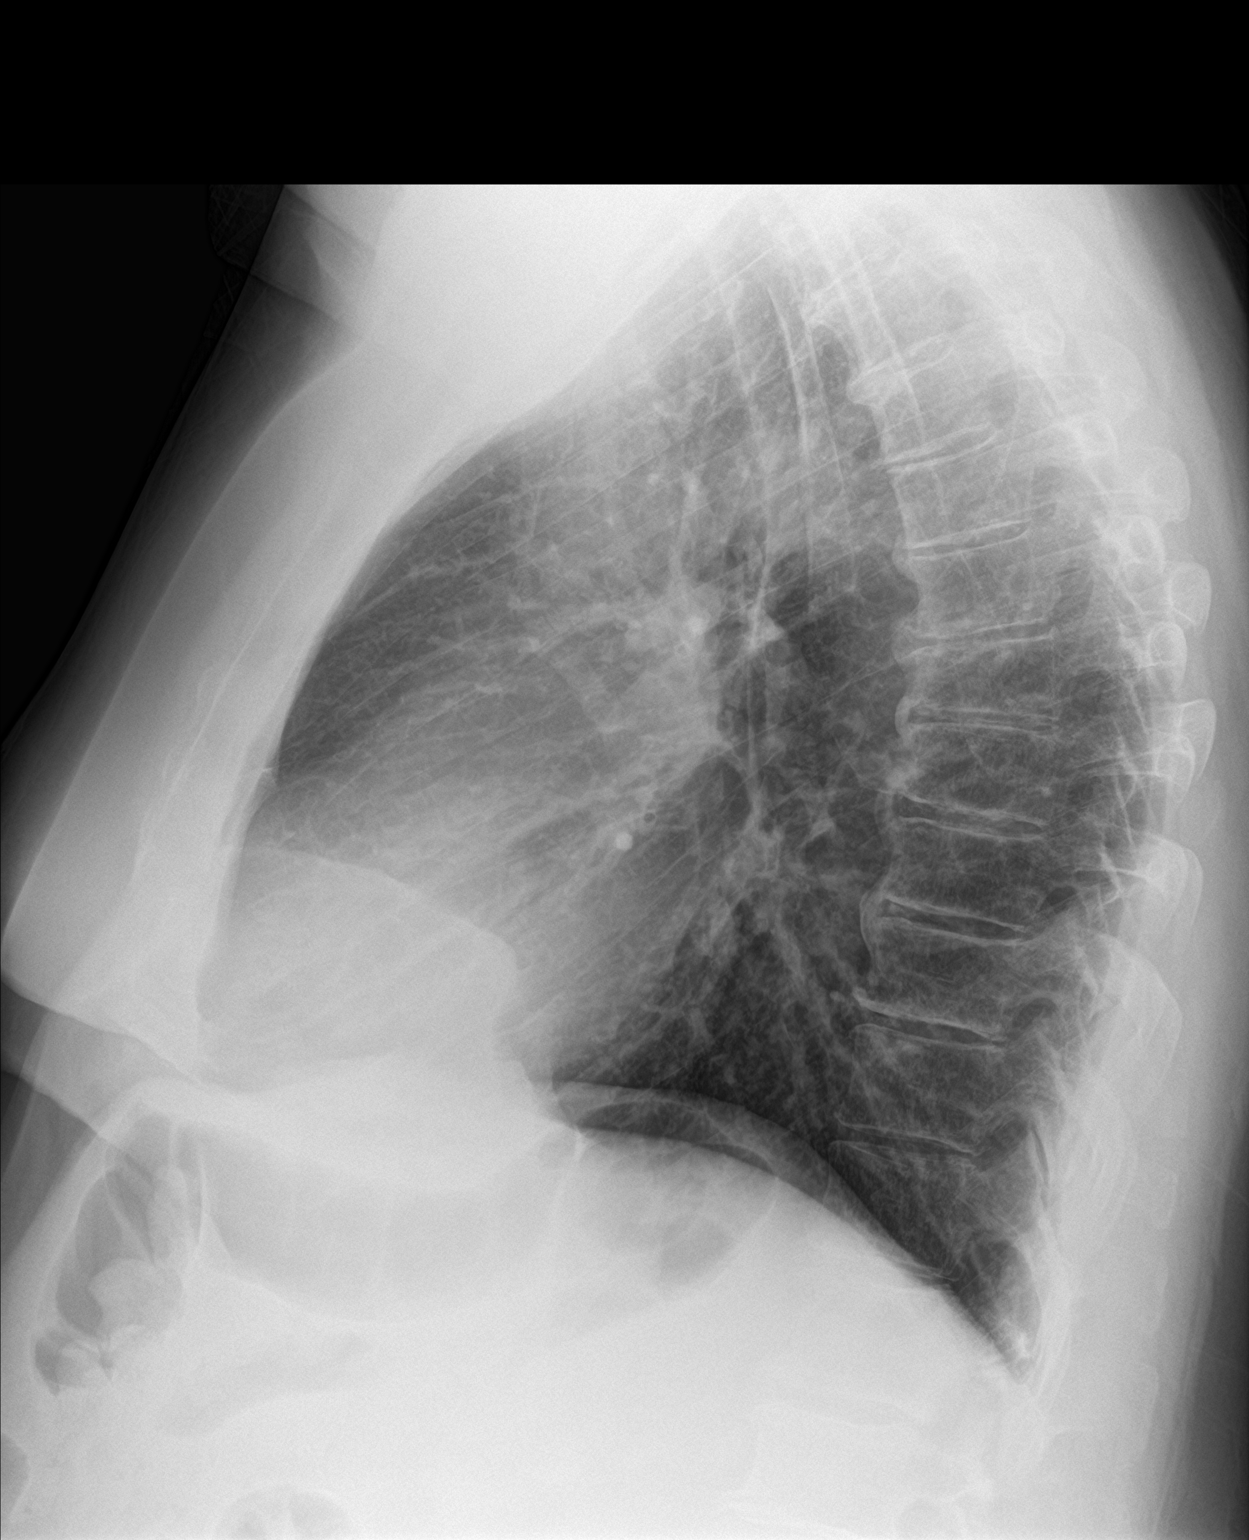

[2 of 2 positions shown; findings below may reference images not displayed]

FINDINGS: Cardiac shadow is within normal limits. The lungs are well aerated
bilaterally without focal infiltrate or sizable effusion.
Degenerative changes of the thoracic spine are noted. No other focal
abnormality is seen.
IMPRESSION: No active cardiopulmonary disease.

## 2020-03-21 ENCOUNTER — Other Ambulatory Visit: Payer: Self-pay | Admitting: Pulmonary Disease

## 2020-03-21 DIAGNOSIS — J449 Chronic obstructive pulmonary disease, unspecified: Secondary | ICD-10-CM

## 2020-03-21 DIAGNOSIS — R0602 Shortness of breath: Secondary | ICD-10-CM

## 2020-03-26 ENCOUNTER — Other Ambulatory Visit: Payer: Self-pay | Admitting: Pulmonary Disease

## 2020-03-26 DIAGNOSIS — R0602 Shortness of breath: Secondary | ICD-10-CM

## 2020-03-26 DIAGNOSIS — J449 Chronic obstructive pulmonary disease, unspecified: Secondary | ICD-10-CM

## 2020-04-03 ENCOUNTER — Telehealth: Payer: Self-pay | Admitting: Pulmonary Disease

## 2020-04-03 NOTE — Telephone Encounter (Signed)
LMTCB

## 2020-04-11 NOTE — Telephone Encounter (Signed)
lmtcb for pt.  

## 2020-04-16 NOTE — Telephone Encounter (Signed)
ATC patient-- received recording that the number I had dialed is not in service.  Will close encounter per office protocol, as this is third attempt to contact patient.

## 2020-04-28 ENCOUNTER — Other Ambulatory Visit: Payer: Self-pay | Admitting: Pulmonary Disease

## 2020-04-28 DIAGNOSIS — J449 Chronic obstructive pulmonary disease, unspecified: Secondary | ICD-10-CM

## 2020-04-28 DIAGNOSIS — R0602 Shortness of breath: Secondary | ICD-10-CM

## 2020-06-03 DIAGNOSIS — M79605 Pain in left leg: Secondary | ICD-10-CM | POA: Diagnosis not present

## 2020-06-03 DIAGNOSIS — M545 Low back pain, unspecified: Secondary | ICD-10-CM | POA: Diagnosis not present

## 2020-06-03 DIAGNOSIS — M79604 Pain in right leg: Secondary | ICD-10-CM | POA: Diagnosis not present

## 2020-06-03 DIAGNOSIS — M542 Cervicalgia: Secondary | ICD-10-CM | POA: Diagnosis not present

## 2020-07-29 ENCOUNTER — Other Ambulatory Visit: Payer: Self-pay | Admitting: Pulmonary Disease

## 2020-07-29 DIAGNOSIS — J449 Chronic obstructive pulmonary disease, unspecified: Secondary | ICD-10-CM

## 2020-07-29 DIAGNOSIS — R0602 Shortness of breath: Secondary | ICD-10-CM

## 2020-08-01 ENCOUNTER — Telehealth: Payer: Self-pay | Admitting: Pulmonary Disease

## 2020-08-01 NOTE — Telephone Encounter (Signed)
Patient called mine phone today and stated that his insurance will not allow him to get his Trelegy again until 3/18 and he will run out before then. Is there anyway that Dr. Isaiah Serge might give him some prednisone to help fill in the gap until he gets his next refill of Trelegy.  It cost 700.00.  He admits sometimes he does lose a dose due to his hands

## 2020-08-01 NOTE — Telephone Encounter (Signed)
Tried calling the pt and there was no answer and no option to leave msg. Will call back.

## 2020-08-04 MED ORDER — TRELEGY ELLIPTA 100-62.5-25 MCG/INH IN AEPB: 1.0000 | INHALATION_SPRAY | Freq: Every day | RESPIRATORY_TRACT | 0 refills | Status: DC

## 2020-08-04 NOTE — Telephone Encounter (Signed)
Spoke with the pt. He states that he can not get his Trelegy filled until 3/18- he wasted a few doses on accident bc he has problems with his hands and accidentally closes the inhaler too soon. I advised will leave a couple of samples so he is not without his medication. 2 samples up front for pick up. Nothing further needed.

## 2020-08-22 ENCOUNTER — Inpatient Hospital Stay: Admission: RE | Admit: 2020-08-22 | Payer: Medicare Other | Source: Ambulatory Visit

## 2020-09-09 DIAGNOSIS — M545 Low back pain, unspecified: Secondary | ICD-10-CM | POA: Diagnosis not present

## 2020-09-09 DIAGNOSIS — G8929 Other chronic pain: Secondary | ICD-10-CM | POA: Diagnosis not present

## 2020-09-09 DIAGNOSIS — G894 Chronic pain syndrome: Secondary | ICD-10-CM | POA: Diagnosis not present

## 2020-09-09 DIAGNOSIS — M79605 Pain in left leg: Secondary | ICD-10-CM | POA: Diagnosis not present

## 2020-09-09 DIAGNOSIS — M79604 Pain in right leg: Secondary | ICD-10-CM | POA: Diagnosis not present

## 2020-09-09 DIAGNOSIS — M542 Cervicalgia: Secondary | ICD-10-CM | POA: Diagnosis not present

## 2020-09-10 DIAGNOSIS — F411 Generalized anxiety disorder: Secondary | ICD-10-CM | POA: Diagnosis not present

## 2020-09-10 DIAGNOSIS — F40248 Other situational type phobia: Secondary | ICD-10-CM | POA: Diagnosis not present

## 2020-09-10 DIAGNOSIS — F5101 Primary insomnia: Secondary | ICD-10-CM | POA: Diagnosis not present

## 2020-09-10 DIAGNOSIS — F339 Major depressive disorder, recurrent, unspecified: Secondary | ICD-10-CM | POA: Diagnosis not present

## 2020-09-12 ENCOUNTER — Inpatient Hospital Stay: Admission: RE | Admit: 2020-09-12 | Payer: Medicare Other | Source: Ambulatory Visit

## 2020-09-15 ENCOUNTER — Other Ambulatory Visit: Payer: Self-pay | Admitting: Pulmonary Disease

## 2020-09-15 DIAGNOSIS — R0602 Shortness of breath: Secondary | ICD-10-CM

## 2020-09-15 DIAGNOSIS — J449 Chronic obstructive pulmonary disease, unspecified: Secondary | ICD-10-CM

## 2020-09-16 ENCOUNTER — Other Ambulatory Visit: Payer: Self-pay | Admitting: Pulmonary Disease

## 2020-09-16 DIAGNOSIS — R0602 Shortness of breath: Secondary | ICD-10-CM

## 2020-09-16 DIAGNOSIS — J449 Chronic obstructive pulmonary disease, unspecified: Secondary | ICD-10-CM

## 2020-09-16 IMAGING — CT CT CHEST W/O CM
2 of 3 series · 14 of 36 positions shown, 17 images · non-contrast
Comparison: Prior radiograph from 12/02/2017 as well as previous CT
from 05/24/2017.

CLINICAL DATA: Follow-up examination for pulmonary nodule, low
risk. No current complaints.

EXAM:
CT CHEST WITHOUT CONTRAST
TECHNIQUE: Multidetector CT imaging of the chest was performed following the
standard protocol without IV contrast.

[Series 2: thorax · axial · 0.78mm/px · z∈[+1354,+1644]mm · 11 of 171 slices shown, 14 images]
[im 13/171  mediastinal]
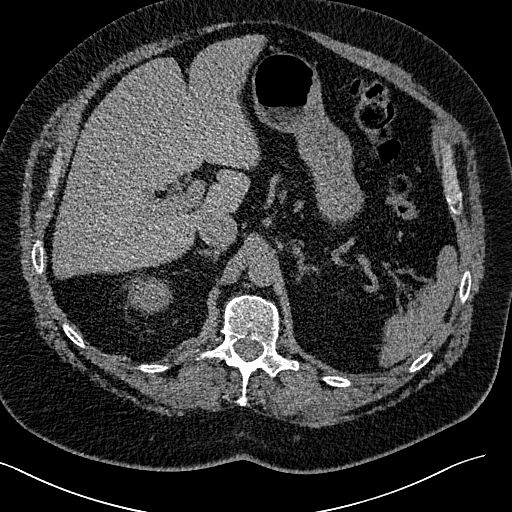
[im 13/171  lung]
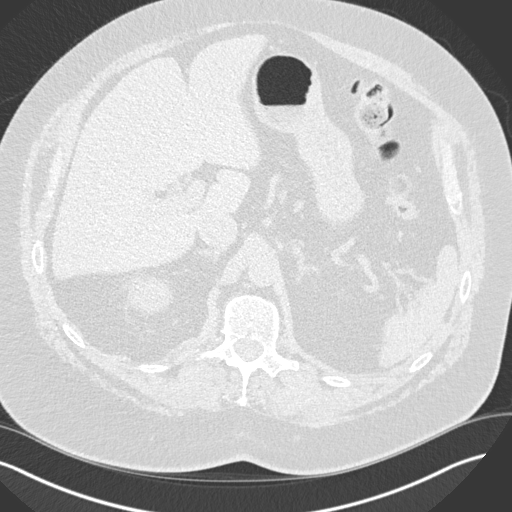
[im 26/171  lung]
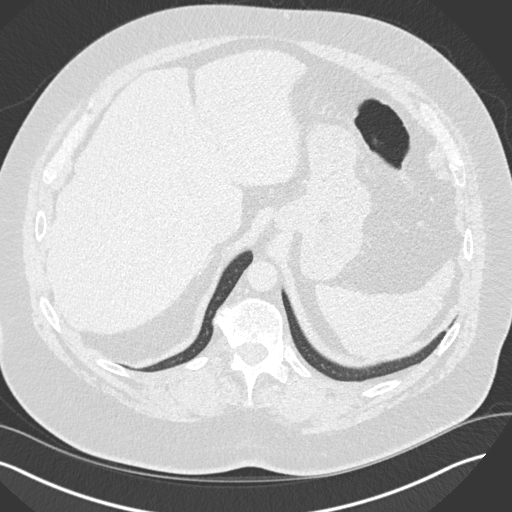
[im 38/171  lung]
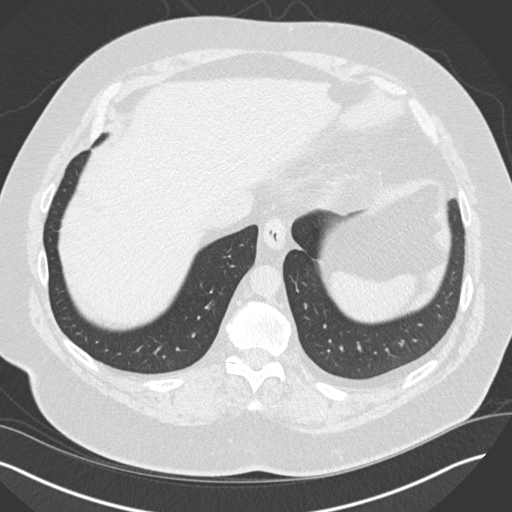
[im 57/171  lung]
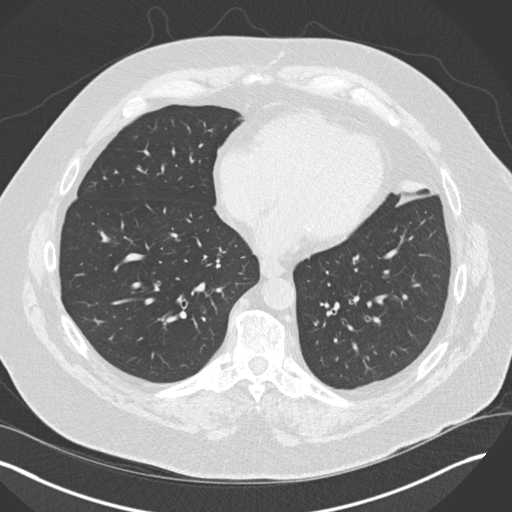
[im 70/171  mediastinal]
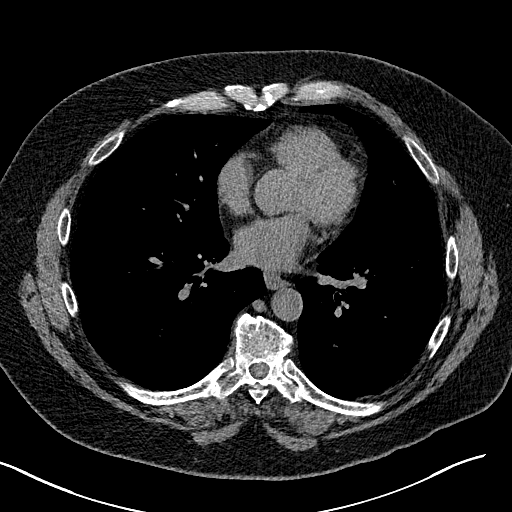
[im 70/171  lung]
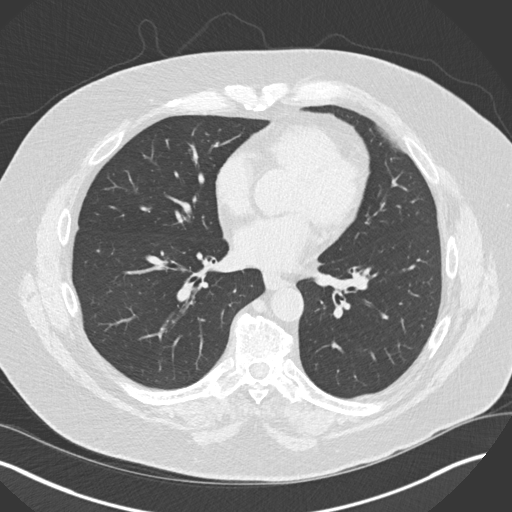
[im 89/171  lung]
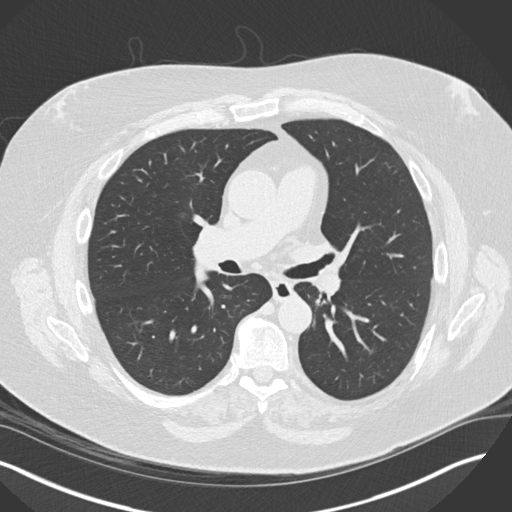
[im 101/171  lung]
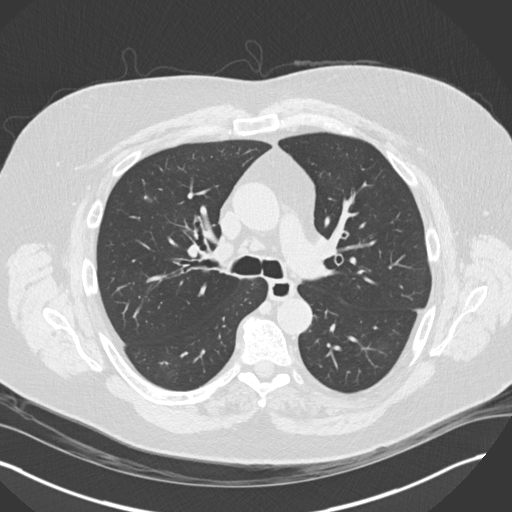
[im 114/171  lung]
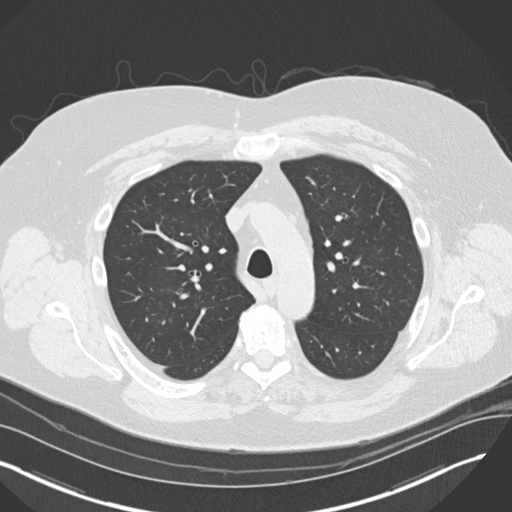
[im 133/171  mediastinal]
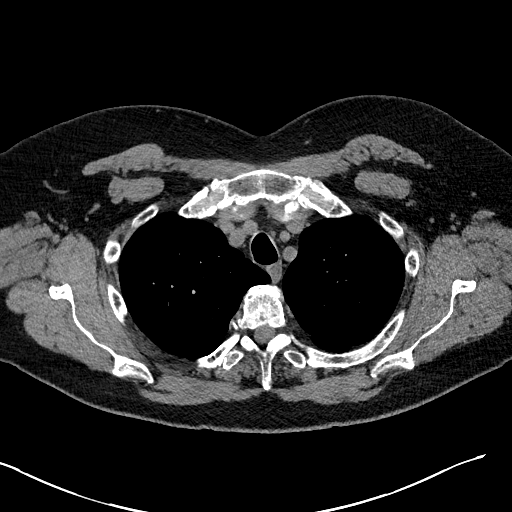
[im 133/171  lung]
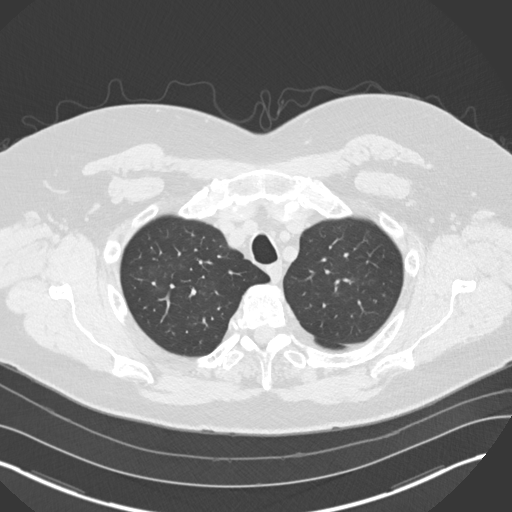
[im 145/171  lung]
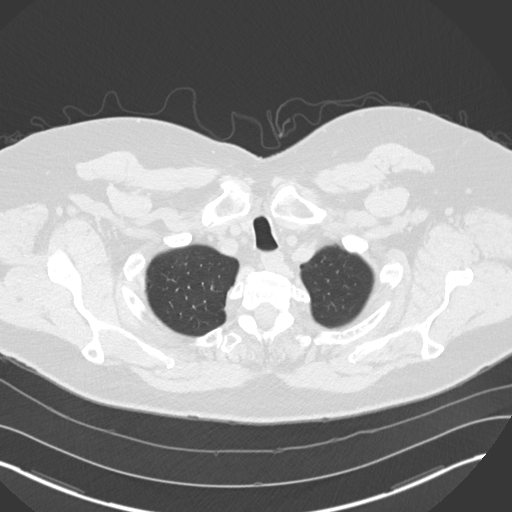
[im 158/171  lung]
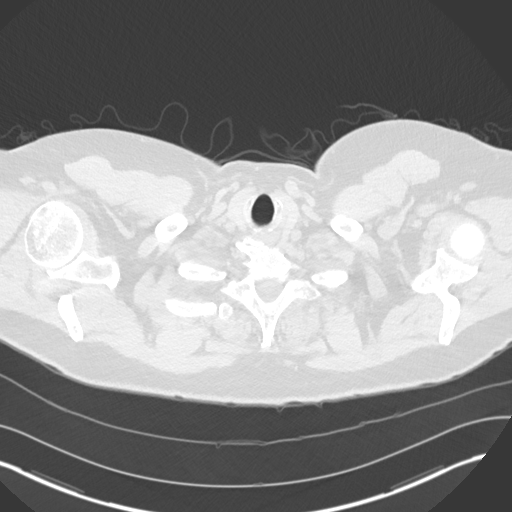

[Series 5: coronal · coronal · 0.67mm/px · 3 of 151 slices shown]
[im 31/151  lung]
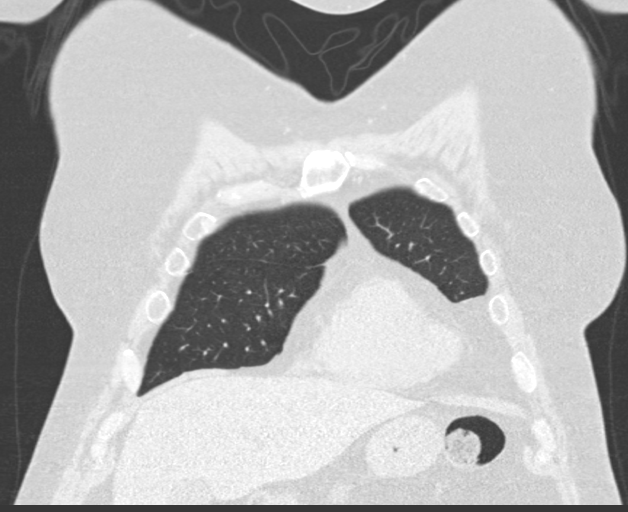
[im 61/151  lung]
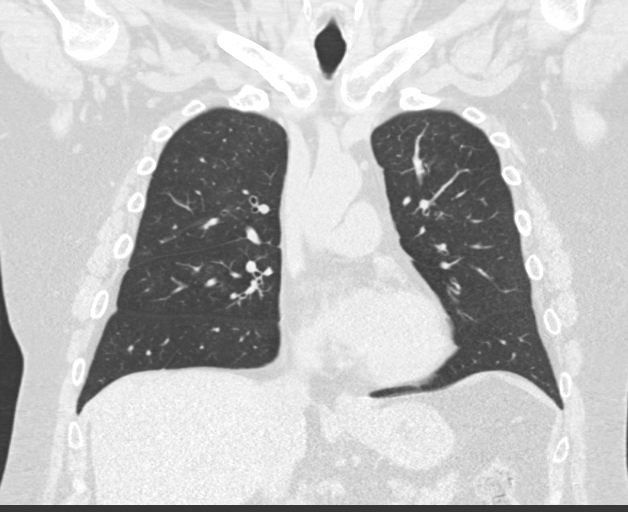
[im 91/151  lung]
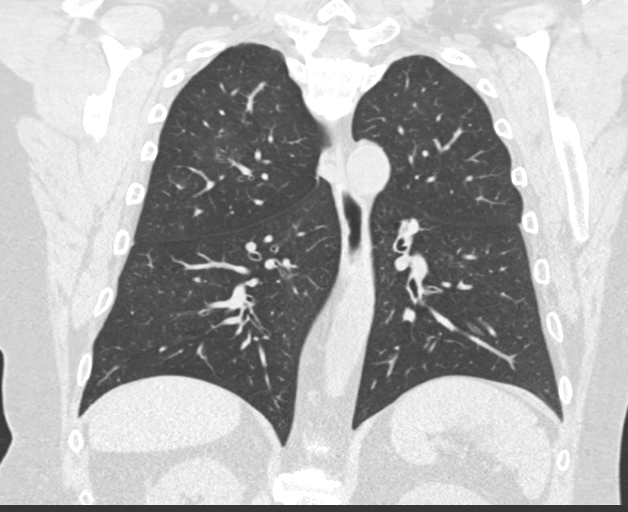

[14 of 36 positions shown; findings below may reference images not displayed]

FINDINGS: Cardiovascular: Intrathoracic aorta of normal caliber. Minimal
atheromatous plaque within the aortic arch for patient age.
Partially visualized great vessels within normal limits. Heart size
normal. No pericardial effusion. Mild scattered coronary artery
calcifications noted within the LAD. Limited noncontrast evaluation
of the pulmonary arterial tree grossly unremarkable.

Mediastinum/Nodes: Thyroid normal. No pathologically enlarged
mediastinal, hilar, or axillary lymph nodes identified. Esophagus
within normal limits.

Lungs/Pleura: Tracheobronchial tree intact and widely patent. Lungs
well inflated bilaterally. No pulmonary edema or pleural effusion.
No pneumothorax. Again seen are subtle areas of ground-glass opacity
with mucoid impaction and bronchiectasis within the central aspect
of the right upper lobe, similar to previous exam. There is a new 5
mm pulmonary nodule within this region (series 3, image 43).
Previously identified 3 mm nodule within the subpleural left upper
lobe is relatively stable measuring 3 mm in size (series 3, image
43).

Upper Abdomen: Visualized upper abdomen demonstrates no acute
finding. 2.2 cm intermediate density right adrenal nodule noted,
indeterminate.

Musculoskeletal: No acute osseous abnormality. No discrete lytic or
blastic osseous lesions. Mild multilevel degenerative endplate
spurring seen throughout the thoracic spine. Bilateral gynecomastia
noted.
IMPRESSION: 1. Persistent subtle areas of ground-glass attenuation with airway
impaction and bronchiectasis within the central aspect of the right
upper lobe, similar to previous. Finding could reflect sequelae of
chronic endobronchial/atypical infection or possibly aspiration.
2. New 5 mm pulmonary nodule superimposed within the central aspect
of the right upper lobe, indeterminate. No follow-up needed if
patient is low-risk. Non-contrast chest CT can be considered in 12
months if patient is high-risk. This recommendation follows the
consensus statement: Guidelines for Management of Incidental
Pulmonary Nodules Detected on CT Images: From the [HOSPITAL]
3. Unchanged 3 mm subpleural left upper lobe pulmonary nodule. Given
stability, this is likely benign in nature.
4. 2.2 cm right adrenal nodule, indeterminate. Again, further
evaluation with dedicated MRI of the abdomen could be performed for
further evaluation as clinically warranted.

## 2020-09-17 ENCOUNTER — Telehealth: Payer: Self-pay | Admitting: Pulmonary Disease

## 2020-09-17 MED ORDER — ALBUTEROL SULFATE HFA 108 (90 BASE) MCG/ACT IN AERS: 1.0000 | INHALATION_SPRAY | Freq: Four times a day (QID) | RESPIRATORY_TRACT | 6 refills | Status: DC | PRN

## 2020-09-17 NOTE — Telephone Encounter (Signed)
Called and spoke with pt and he stated that he did get his ventolin HFA this month but [employer] will not cover this med for next month.  He stated that they will cover the generic proair hfa.  This has been sent to the pharmacy and I did put a note on it that it will not need to be filled until next month

## 2020-09-25 ENCOUNTER — Other Ambulatory Visit: Payer: Self-pay | Admitting: *Deleted

## 2020-09-25 DIAGNOSIS — R911 Solitary pulmonary nodule: Secondary | ICD-10-CM

## 2020-10-07 ENCOUNTER — Telehealth: Payer: Self-pay | Admitting: Pulmonary Disease

## 2020-10-07 NOTE — Telephone Encounter (Signed)
Attempted to call patient, left message on voicemail to please return phone call. Contact number provided.

## 2020-10-08 NOTE — Telephone Encounter (Signed)
Spoke with the pt  He states he had called regarding ct appt but this was already scheduled, and he already has appt with Dr Isaiah Serge, so nothing needed at this time The call was d/c prematurely, and I tried calling him back but no answer  I am closing since he had stated there was nothing further needed

## 2020-10-08 NOTE — Telephone Encounter (Signed)
LMTCB- will keep open to see if he calls back since called so late in the day 10/07/20.

## 2020-10-10 ENCOUNTER — Other Ambulatory Visit: Payer: Medicare Other

## 2020-10-21 ENCOUNTER — Ambulatory Visit
Admission: RE | Admit: 2020-10-21 | Discharge: 2020-10-21 | Disposition: A | Discharge status: Home / Self Care | Payer: Medicare Other | Source: Ambulatory Visit | Attending: Pulmonary Disease | Admitting: Pulmonary Disease

## 2020-10-21 DIAGNOSIS — R911 Solitary pulmonary nodule: Secondary | ICD-10-CM

## 2020-10-21 DIAGNOSIS — R918 Other nonspecific abnormal finding of lung field: Secondary | ICD-10-CM | POA: Diagnosis not present

## 2020-10-23 ENCOUNTER — Ambulatory Visit: Payer: Medicare Other | Admitting: Pulmonary Disease

## 2020-10-23 ENCOUNTER — Other Ambulatory Visit: Payer: Self-pay

## 2020-10-23 ENCOUNTER — Encounter: Payer: Self-pay | Admitting: Adult Health

## 2020-10-23 ENCOUNTER — Ambulatory Visit (INDEPENDENT_AMBULATORY_CARE_PROVIDER_SITE_OTHER): Payer: Medicare Other | Admitting: Adult Health

## 2020-10-23 VITALS — BP 148/64 | HR 87 | Temp 98.4°F | Resp 21 | Ht 70.0 in | Wt 305.6 lb

## 2020-10-23 DIAGNOSIS — J449 Chronic obstructive pulmonary disease, unspecified: Secondary | ICD-10-CM | POA: Diagnosis not present

## 2020-10-23 DIAGNOSIS — F17200 Nicotine dependence, unspecified, uncomplicated: Secondary | ICD-10-CM

## 2020-10-23 DIAGNOSIS — R918 Other nonspecific abnormal finding of lung field: Secondary | ICD-10-CM | POA: Diagnosis not present

## 2020-10-23 DIAGNOSIS — I2699 Other pulmonary embolism without acute cor pulmonale: Secondary | ICD-10-CM | POA: Insufficient documentation

## 2020-10-23 DIAGNOSIS — Z720 Tobacco use: Secondary | ICD-10-CM

## 2020-10-23 NOTE — Assessment & Plan Note (Signed)
Stable on recent CT chest over the last 2 years.  Consistent with a benign etiology.  Patient is encouraged on smoking cessation.  Will refer to the low-dose CT screening program

## 2020-10-23 NOTE — Assessment & Plan Note (Signed)
History of PE and DVT.  On lifelong anticoagulation Continue on Xarelto.  Advised on avoidance of nonsteroidals

## 2020-10-23 NOTE — Assessment & Plan Note (Signed)
Smoking cessation discussed 

## 2020-10-23 NOTE — Patient Instructions (Addendum)
Continue on TRELEGY 1 puff daily  Albuterol inhaler As needed   Activity as tolerated.  Refer to LDCT chest program , Jefm Bryant NP  Work on not smoking .  Healthy weight loss.  Follow up with Primary MD regarding adrenal nodule and atherosclerosis .  Follow up with Dr. Isaiah Serge in 6 months and As needed

## 2020-10-23 NOTE — Assessment & Plan Note (Signed)
Compensated on present regimen.  No changes  Plan  Patient Instructions  Continue on TRELEGY 1 puff daily  Albuterol inhaler As needed   Activity as tolerated.  Refer to LDCT chest program , Jefm Bryant NP  Work on not smoking .  Healthy weight loss.  Follow up with Primary MD regarding adrenal nodule and atherosclerosis .  Follow up with Dr. Isaiah Serge in 6 months and As needed

## 2020-10-23 NOTE — Progress Notes (Signed)
@Patient  ID: , male    DOB: 1956/05/31, 65 y.o.   MRN: [id number]  Chief Complaint  Patient presents with  . Follow-up    Referring provider: [id number], MD  HPI: 65 year old male active smoker (24 yr pk /hx ) , followed for COPD. Smoking hx smoking 1 PPD x 23 yr , then 5-10 cigs/day since then .  Medical history significant for DVT and PE on lifelong anticoagulation (previously seen by hematologist when living in [place] and told him he had a mutation and would need lifelong anticoagulation)  TEST/EVENTS :  CT chest August 06, 2019 showed diminished or stable pulmonary nodules and opacities    10/23/2020 Follow up ; COPD , PE/DVT , Lung nodules  Patient returns for a 1 year follow-up.  Patient has underlying COPD.  He is maintained on Trelegy inhaler daily.  Says overall he feels like Trelegy is helping.  He does get short of breath with activities.  He is sedentary and has low activity tolerance.  He denies any increased cough or wheezing.  Says he uses his albuterol a couple times a week.  Patient does have known pulmonary nodules on CT scan.  Patient had a follow-up CT chest that was completed Oct 22, 2020 that showed stable pulmonary nodules compatible with a benign etiology.  Incidental finding of stable right adrenal lesion measuring 2.2 cm and aortic atherosclerosis.  We discussed his incidental findings with recommendations to follow-up with his primary care provider. We discussed referral to the low-dose CT screening program and he is in agreement  Patient was diagnosed with a DVT and PE in the past.  He was evaluated by hematologist while living in Oct [place], 2020.  He is on lifelong anticoagulation with Xarelto.  Patient says he is doing well has no known bleeding.  Patient does continue smoke.  We discussed smoking cessation.  No Known Allergies  Immunization History  Administered Date(s) Administered  . Influenza Split 03/07/2014  . Influenza,inj,Quad PF,6+ Mos  05/10/2018, 02/21/2019  . Influenza-Unspecified 02/05/2017    Past Medical History:  Diagnosis Date  . Chronic pain   . Depression   . DVT (deep venous thrombosis) (HCC)   . GAD (generalized anxiety disorder)   . Hyperglycemia   . PE (pulmonary embolism)     Tobacco History: Social History   Tobacco Use  Smoking Status Current Every Day Smoker  . Packs/day: 0.25  . Years: 30.00  . Pack years: 7.50  Smokeless Tobacco Never Used  Tobacco Comment   3-4 per day    Ready to quit: No Counseling given: Yes Comment: 3-4 per day    Outpatient Medications Prior to Visit  Medication Sig Dispense Refill  . albuterol (PROAIR HFA) 108 (90 Base) MCG/ACT inhaler Inhale 1-2 puffs into the lungs every 6 (six) hours as needed for wheezing or shortness of breath. 8 g 6  . clonazePAM (KLONOPIN) 1 MG tablet 1 mg.    . escitalopram (LEXAPRO) 10 MG tablet Take 10 mg by mouth.    . escitalopram (LEXAPRO) 5 MG tablet Take 5 mg by mouth daily.     . Fluticasone-Umeclidin-Vilant (TRELEGY ELLIPTA) 100-62.5-25 MCG/INH AEPB Inhale 1 puff into the lungs daily. 14 each 0  . traZODone (DESYREL) 100 MG tablet Take 200 mg by mouth at bedtime as needed.    . TRELEGY ELLIPTA 100-62.5-25 MCG/INH AEPB INHALE 1 PUFF INTO THE LUNGS DAILY 60 each 1  . Vitamin D, Ergocalciferol, (DRISDOL) 50000 units CAPS capsule Take  1 capsule by mouth 2 (two) times a week.  0  . XARELTO 20 MG TABS tablet Take 20 mg by mouth daily.  0  . XTAMPZA ER 36 MG C12A Take 2 capsules by mouth 3 (three) times daily.  0   No facility-administered medications prior to visit.     Review of Systems:   Constitutional:   No  weight loss, night sweats,  Fevers, chills,  +fatigue, or  lassitude.  HEENT:   No headaches,  Difficulty swallowing,  Tooth/dental problems, or  Sore throat,                No sneezing, itching, ear ache, nasal congestion, post nasal drip,   CV:  No chest pain,  Orthopnea, PND, swelling in lower extremities,  anasarca, dizziness, palpitations, syncope.   GI  No heartburn, indigestion, abdominal pain, nausea, vomiting, diarrhea, change in bowel habits, loss of appetite, bloody stools.   Resp:    No chest wall deformity  Skin: no rash or lesions.  GU: no dysuria, change in color of urine, no urgency or frequency.  No flank pain, no hematuria   MS:  No joint pain or swelling.  No decreased range of motion.  No back pain.    Physical Exam  BP (!) 148/64 (BP Location: Left Arm, Patient Position: Sitting, Cuff Size: Normal)   Pulse 87   Temp 98.4 F (36.9 C) (Skin)   Resp (!) 21   Ht 5\' 10"  (1.778 m)   Wt (!) 305 lb 9.6 oz (138.6 kg)   SpO2 91%   BMI 43.85 kg/m   GEN: A/Ox3; pleasant , NAD, well nourished    HEENT:  [place]/AT,   NOSE-clear, THROAT-clear, no lesions, no postnasal drip or exudate noted.   NECK:  Supple w/ fair ROM; no JVD; normal carotid impulses w/o bruits; no thyromegaly or nodules palpated; no lymphadenopathy.    RESP  Clear  P & A; w/o, wheezes/ rales/ or rhonchi. no accessory muscle use, no dullness to percussion  CARD:  RRR, no m/r/g, 1+ peripheral edema, pulses intact, no cyanosis or clubbing.  GI:   Soft & nt; nml bowel sounds; no organomegaly or masses detected.   Musco: Warm bil, no deformities or joint swelling noted.   Neuro: alert, no focal deficits noted.    Skin: Warm, no lesions or rashes    Lab Results:  CBC  BMET No results found for: NA, K, CL, CO2, GLUCOSE, BUN, CREATININE, CALCIUM, GFRNONAA, GFRAA  BNP No results found for: BNP  ProBNP No results found for: PROBNP  Imaging: CT Chest Wo Contrast  Result Date: 10/22/2020 CLINICAL DATA:  Lung nodule follow-up bilateral lung nodules in a 65 year old male. EXAM: CT CHEST WITHOUT CONTRAST TECHNIQUE: Multidetector CT imaging of the chest was performed following the standard protocol without IV contrast. COMPARISON:  Comparison made with August 06, 2019. FINDINGS: Cardiovascular: Minimal  calcified atheromatous plaque of the thoracic aorta. Normal heart size. No substantial pericardial fluid. Central pulmonary vasculature is normal caliber. Limited assessment of cardiovascular structures given lack of intravenous contrast. Mediastinum/Nodes: Esophagus grossly normal. No adenopathy in the chest. Lungs/Pleura: No signs of consolidation or evidence of pleural effusion. Small nodular density at the RIGHT lung base is unchanged. Scarring in the lingula with similar appearance. Branching features when viewed in the coronal plane with respect to the RIGHT lower lobe process which shows a stable appearance dating back to 2020 when measured in the axial plane measuring approximately 8 x 7  mm. RIGHT upper lobe pulmonary nodule on image 42 of series 8 measuring 3 mm unchanged. No new or suspicious pulmonary nodule. Upper Abdomen: Incidental imaging of upper abdominal contents without acute process. Well-circumscribed RIGHT adrenal lesion measures 2.2 x 1.6 cm density value of 37 Hounsfield units. Not changed since June 09, 2018. Also not significantly changed dating back to the exam of December of 2018. Musculoskeletal: No acute musculoskeletal process. Spinal degenerative changes. IMPRESSION: 1. Stable pulmonary nodules compatible with benign pulmonary nodules. No new or suspicious findings. 2. Well-circumscribed RIGHT adrenal lesion measures 2.2 x 1.6 cm density value of 37 Hounsfield units. Given stability for greater than 2 years this is probably benign and likely a small adrenal adenoma. 3. Aortic atherosclerosis. Electronically Signed   By: Donzetta Kohut M.D.   On: 10/22/2020 17:56      PFT Results Latest Ref Rng & Units 05/09/2017  FVC-Pre L 3.21  FVC-Predicted Pre % 69  FVC-Post L 3.50  FVC-Predicted Post % 75  Pre FEV1/FVC % % 66  Post FEV1/FCV % % 68  FEV1-Pre L 2.12  FEV1-Predicted Pre % 60  FEV1-Post L 2.38  DLCO uncorrected ml/min/mmHg 29.52  DLCO UNC% % 95  DLCO corrected  ml/min/mmHg 30.42  DLCO COR %Predicted % 98  DLVA Predicted % 115  TLC L 7.45  TLC % Predicted % 109  RV % Predicted % 192    No results found for: NITRICOXIDE      Assessment & Plan:   COPD (chronic obstructive pulmonary disease) (HCC) Compensated on present regimen.  No changes  Plan  Patient Instructions  Continue on TRELEGY 1 puff daily  Albuterol inhaler As needed   Activity as tolerated.  Refer to LDCT chest program , Jefm Bryant NP  Work on not smoking .  Healthy weight loss.  Follow up with Primary MD regarding adrenal nodule and atherosclerosis .  Follow up with Dr. Isaiah Serge in 6 months and As needed         Tobacco abuse Smoking cessation discussed  Pulmonary nodules Stable on recent CT chest over the last 2 years.  Consistent with a benign etiology.  Patient is encouraged on smoking cessation.  Will refer to the low-dose CT screening program  Pulmonary embolism (HCC) History of PE and DVT.  On lifelong anticoagulation Continue on Xarelto.  Advised on avoidance of nonsteroidals     Rubye Oaks, NP 10/23/2020

## 2020-11-05 ENCOUNTER — Other Ambulatory Visit: Payer: Self-pay | Admitting: Pulmonary Disease

## 2020-11-05 DIAGNOSIS — J449 Chronic obstructive pulmonary disease, unspecified: Secondary | ICD-10-CM

## 2020-11-05 DIAGNOSIS — R0602 Shortness of breath: Secondary | ICD-10-CM

## 2020-11-06 ENCOUNTER — Other Ambulatory Visit: Payer: Self-pay | Admitting: Pulmonary Disease

## 2020-11-06 DIAGNOSIS — R0602 Shortness of breath: Secondary | ICD-10-CM

## 2020-11-06 DIAGNOSIS — J449 Chronic obstructive pulmonary disease, unspecified: Secondary | ICD-10-CM

## 2020-11-13 DIAGNOSIS — E559 Vitamin D deficiency, unspecified: Secondary | ICD-10-CM | POA: Diagnosis not present

## 2020-11-13 DIAGNOSIS — Z79899 Other long term (current) drug therapy: Secondary | ICD-10-CM | POA: Diagnosis not present

## 2020-11-13 DIAGNOSIS — R739 Hyperglycemia, unspecified: Secondary | ICD-10-CM | POA: Diagnosis not present

## 2020-11-13 DIAGNOSIS — E291 Testicular hypofunction: Secondary | ICD-10-CM | POA: Diagnosis not present

## 2020-11-13 DIAGNOSIS — R5383 Other fatigue: Secondary | ICD-10-CM | POA: Diagnosis not present

## 2020-12-01 DIAGNOSIS — F5101 Primary insomnia: Secondary | ICD-10-CM | POA: Diagnosis not present

## 2020-12-01 DIAGNOSIS — F339 Major depressive disorder, recurrent, unspecified: Secondary | ICD-10-CM | POA: Diagnosis not present

## 2020-12-01 DIAGNOSIS — F411 Generalized anxiety disorder: Secondary | ICD-10-CM | POA: Diagnosis not present

## 2020-12-01 DIAGNOSIS — F40248 Other situational type phobia: Secondary | ICD-10-CM | POA: Diagnosis not present

## 2020-12-31 ENCOUNTER — Telehealth: Payer: Self-pay | Admitting: Pulmonary Disease

## 2020-12-31 MED ORDER — TRELEGY ELLIPTA 100-62.5-25 MCG/INH IN AEPB: 1.0000 | INHALATION_SPRAY | Freq: Every day | RESPIRATORY_TRACT | 0 refills | Status: DC

## 2020-12-31 NOTE — Telephone Encounter (Signed)
I have called the pt and he is aware of samples that have been left up front for him to pick up.  Nothing further is needed.

## 2021-02-03 ENCOUNTER — Other Ambulatory Visit: Payer: Self-pay | Admitting: Pulmonary Disease

## 2021-02-03 DIAGNOSIS — R0602 Shortness of breath: Secondary | ICD-10-CM

## 2021-02-03 DIAGNOSIS — J449 Chronic obstructive pulmonary disease, unspecified: Secondary | ICD-10-CM

## 2021-02-25 DIAGNOSIS — F5101 Primary insomnia: Secondary | ICD-10-CM | POA: Diagnosis not present

## 2021-02-25 DIAGNOSIS — F411 Generalized anxiety disorder: Secondary | ICD-10-CM | POA: Diagnosis not present

## 2021-02-25 DIAGNOSIS — F40248 Other situational type phobia: Secondary | ICD-10-CM | POA: Diagnosis not present

## 2021-02-25 DIAGNOSIS — F339 Major depressive disorder, recurrent, unspecified: Secondary | ICD-10-CM | POA: Diagnosis not present

## 2021-03-23 ENCOUNTER — Other Ambulatory Visit: Payer: Self-pay | Admitting: Pulmonary Disease

## 2021-04-21 ENCOUNTER — Other Ambulatory Visit: Payer: Self-pay | Admitting: Pulmonary Disease

## 2021-04-21 DIAGNOSIS — R0602 Shortness of breath: Secondary | ICD-10-CM

## 2021-04-21 DIAGNOSIS — J449 Chronic obstructive pulmonary disease, unspecified: Secondary | ICD-10-CM

## 2021-05-12 DIAGNOSIS — R82998 Other abnormal findings in urine: Secondary | ICD-10-CM | POA: Diagnosis not present

## 2021-05-14 DIAGNOSIS — M542 Cervicalgia: Secondary | ICD-10-CM | POA: Diagnosis not present

## 2021-05-19 DIAGNOSIS — F339 Major depressive disorder, recurrent, unspecified: Secondary | ICD-10-CM | POA: Diagnosis not present

## 2021-05-19 DIAGNOSIS — F5101 Primary insomnia: Secondary | ICD-10-CM | POA: Diagnosis not present

## 2021-05-19 DIAGNOSIS — F411 Generalized anxiety disorder: Secondary | ICD-10-CM | POA: Diagnosis not present

## 2021-05-19 DIAGNOSIS — F40248 Other situational type phobia: Secondary | ICD-10-CM | POA: Diagnosis not present

## 2021-07-20 ENCOUNTER — Other Ambulatory Visit: Payer: Self-pay | Admitting: Pulmonary Disease

## 2021-07-20 DIAGNOSIS — R0602 Shortness of breath: Secondary | ICD-10-CM

## 2021-07-20 DIAGNOSIS — J449 Chronic obstructive pulmonary disease, unspecified: Secondary | ICD-10-CM

## 2021-07-21 ENCOUNTER — Other Ambulatory Visit: Payer: Self-pay | Admitting: Pulmonary Disease

## 2021-07-22 ENCOUNTER — Telehealth: Payer: Self-pay | Admitting: Pulmonary Disease

## 2021-07-22 ENCOUNTER — Other Ambulatory Visit: Payer: Self-pay | Admitting: Pulmonary Disease

## 2021-07-23 NOTE — Telephone Encounter (Signed)
Albuterol prescription received electronically this morning and was sent to patient requested pharmacy. Pharmacy received and patient aware.  Nothing further at this time.

## 2021-08-04 ENCOUNTER — Other Ambulatory Visit: Payer: Self-pay

## 2021-08-04 ENCOUNTER — Encounter: Payer: Self-pay | Admitting: Pulmonary Disease

## 2021-08-04 ENCOUNTER — Ambulatory Visit (INDEPENDENT_AMBULATORY_CARE_PROVIDER_SITE_OTHER): Payer: Managed Care, Other (non HMO) | Admitting: Pulmonary Disease

## 2021-08-04 VITALS — BP 130/62 | HR 92 | Temp 98.4°F | Ht 70.0 in | Wt 306.6 lb

## 2021-08-04 DIAGNOSIS — Z720 Tobacco use: Secondary | ICD-10-CM

## 2021-08-04 DIAGNOSIS — F17210 Nicotine dependence, cigarettes, uncomplicated: Secondary | ICD-10-CM | POA: Diagnosis not present

## 2021-08-04 DIAGNOSIS — R918 Other nonspecific abnormal finding of lung field: Secondary | ICD-10-CM

## 2021-08-04 DIAGNOSIS — J449 Chronic obstructive pulmonary disease, unspecified: Secondary | ICD-10-CM

## 2021-08-04 NOTE — Progress Notes (Signed)
Cason Luffman    [id number]    1956-03-09  Primary Care Physician:Russo, Jonny Ruiz, MD  Referring Physician: Creola Corn, MD [address] [place],  [place] [address]  Chief complaint:   Follow up for COPD, dyspnea  HPI: Mr. Ergle is a 66 year old with heavy smoking history, active smoker, chronic back pain on opiate medication.  Referred for evaluation of COPD.  He used to live at [place], [place] and recently moved to [place] area in 2017 for his wife's job reason. He still spends his time between [place] and [place]. He has chronic pain issues, back pain. His pain doctor is still in [place] and he travels there to get his pain medications from there. As per PCP note he has H/O PE DVT. He saw a hematologist in [place] and was told he had a mutation and will need to be on life long anticoagulation.  Had a CT scan in December 19 which showed mild inflammation suggestive of atypical infection.  He got Z-Pak for this. Switch from [facility] to Trelegy in 2019.  Symptoms are better with Trelegy inhaler  Interim History:  Continues on Trelegy inhaler.  States that it is working well for him Continues to smoke 1 to 2 cigarettes a day Has gained weight which is affecting his breathing  Outpatient Encounter Medications as of 08/04/2021  Medication Sig   albuterol (VENTOLIN HFA) 108 (90 Base) MCG/ACT inhaler INHALE 1 TO 2 PUFFS INTO THE LUNGS EVERY 6 HOURS AS NEEDED FOR WHEEZING OR SHORTNESS OF BREATH   clonazePAM (KLONOPIN) 1 MG tablet 1 mg.   escitalopram (LEXAPRO) 10 MG tablet Take 10 mg by mouth.   escitalopram (LEXAPRO) 5 MG tablet Take 5 mg by mouth daily.    Fluticasone-Umeclidin-Vilant (TRELEGY ELLIPTA) 100-62.5-25 MCG/ACT AEPB INHALE 1 PUFF INTO THE LUNGS DAILY   traZODone (DESYREL) 100 MG tablet Take 200 mg by mouth at bedtime as needed.   Vitamin D, Ergocalciferol, (DRISDOL) 50000 units CAPS capsule Take 1 capsule by mouth 2 (two) times a week.   XARELTO 20 MG TABS  tablet Take 20 mg by mouth daily.   XTAMPZA ER 36 MG C12A Take 2 capsules by mouth 3 (three) times daily.   [DISCONTINUED] Fluticasone-Umeclidin-Vilant (TRELEGY ELLIPTA) 100-62.5-25 MCG/INH AEPB Inhale 1 puff into the lungs daily.   [DISCONTINUED] Fluticasone-Umeclidin-Vilant (TRELEGY ELLIPTA) 100-62.5-25 MCG/INH AEPB Inhale 1 puff into the lungs daily.   No facility-administered encounter medications on file as of 08/04/2021.   Physical Exam: Blood pressure 130/62, pulse 92, temperature 98.4 F (36.9 C), temperature source Oral, height 5\' 10"  (1.778 m), weight (!) 306 lb 9.6 oz (139.1 kg), SpO2 93 %. Gen:      No acute distress HEENT:  EOMI, sclera anicteric Neck:     No masses; no thyromegaly Lungs:    Clear to auscultation bilaterally; normal respiratory effort CV:         Regular rate and rhythm; no murmurs Abd:      + bowel sounds; soft, non-tender; no palpable masses, no distension Ext:    No edema; adequate peripheral perfusion Skin:      Warm and dry; no rash Neuro: alert and oriented x 3 Psych: normal mood and affect   Data Reviewed: Imaging CT chest 05/24/17- groundglass opacities in right upper lobe.  3 mm left upper lobe nodule.  Adrenal nodule.  Aortic atherosclerosis.  CT chest 06/09/2018-mild groundglass opacities with bronchiectasis in the right upper lobe, stable lung  nodules.  2.2 cm adrenal nodule  CT chest 08/06/2019-diminished or stable lung nodules, bronchial wall thickening. CT 10/21/2020-stable pulmonary nodules, stable adrenal lesion.   I reviewed the images personally  PFTs 05/09/17 FVC 3.5 (75%], FEV1 2.38 [68%), F/F 68, TLC 109%.,  RV/TLC 175%, DLCO 95% Moderate obstruction with bronchodilator response, air trapping  Labs CBC 12/02/2017-WBC 8.9, eos 1.3%, absolute eosinophil count 116 Alpha-1 antitrypsin 11/24/2017-163, PI MM  Assessment:  Follow up for COPD Continue Trelegy inhaler, albuterol as needed  Pulmonary nodule Has remained stable on follow-up  scan and is likely benign.  He is scheduled for low-dose screening CT to start in May 2023  History of DVT/PE, hypercoagulable state Continue Xarelto  Active smoker Discussed smoking cessation again.  He is trying to quit with nicotine lozenges Does not want to try the Chantix. Time spent counseling-5 minutes.  Reevaluate next visit  Plan/Recommendations: - Continue trelegy, albuterol as needed - Low-dose screening CT - Smoking cessation   Chilton Greathouse MD [facility] 08/04/2021, 2:12 PM  CC: Creola Corn, MD

## 2021-08-04 NOTE — Patient Instructions (Signed)
Continue the Trelegy inhaler Work on smoking cessation and weight loss Follow-up in 6 months.

## 2021-09-11 ENCOUNTER — Other Ambulatory Visit: Payer: Self-pay | Admitting: Pulmonary Disease

## 2021-09-11 DIAGNOSIS — R0602 Shortness of breath: Secondary | ICD-10-CM

## 2021-09-11 DIAGNOSIS — J449 Chronic obstructive pulmonary disease, unspecified: Secondary | ICD-10-CM

## 2021-10-05 ENCOUNTER — Other Ambulatory Visit: Payer: Self-pay | Admitting: Pulmonary Disease

## 2021-10-07 ENCOUNTER — Other Ambulatory Visit (HOSPITAL_COMMUNITY): Payer: Self-pay

## 2022-01-27 ENCOUNTER — Ambulatory Visit: Payer: Medicare Other | Admitting: Pulmonary Disease

## 2022-01-28 ENCOUNTER — Telehealth: Payer: Self-pay | Admitting: Pulmonary Disease

## 2022-01-28 MED ORDER — TRELEGY ELLIPTA 100-62.5-25 MCG/ACT IN AEPB: 1.0000 | INHALATION_SPRAY | Freq: Every day | RESPIRATORY_TRACT | 0 refills | Status: DC

## 2022-01-28 NOTE — Telephone Encounter (Signed)
Called and spoke with patient. Patient stated that he would not be able to get his prescription of trelegy until next week. Patient stated that he is going out of town and needed a sample of trelegy. Advised patient that I would leave a sample up front for him to pick up today.   Nothing further needed.

## 2022-02-22 ENCOUNTER — Other Ambulatory Visit: Payer: Self-pay | Admitting: Pulmonary Disease

## 2022-02-22 ENCOUNTER — Ambulatory Visit: Payer: Medicare Other | Admitting: Pulmonary Disease

## 2022-02-22 DIAGNOSIS — J449 Chronic obstructive pulmonary disease, unspecified: Secondary | ICD-10-CM

## 2022-02-22 DIAGNOSIS — R0602 Shortness of breath: Secondary | ICD-10-CM

## 2022-02-24 ENCOUNTER — Other Ambulatory Visit: Payer: Self-pay | Admitting: Pulmonary Disease

## 2022-02-25 ENCOUNTER — Telehealth: Payer: Self-pay | Admitting: Pulmonary Disease

## 2022-02-25 NOTE — Telephone Encounter (Signed)
Pt called in and stated that his pharmacy told him he needs nurse or dr to approve the refill of his medication he requested.

## 2022-02-25 NOTE — Telephone Encounter (Signed)
Pt called in and stated that the pharmacy needs to speak to a nurse or the dr directly to approved the refill of his medication

## 2022-02-25 NOTE — Telephone Encounter (Signed)
[place] and spoke with Pharmacist. Pharmacist stated patient Trelegy prescription was ready for pick up. Called and left message on patient's VM (DPR)  letting patient know Trelegy prescription was ready for pick up and to call with any issues.

## 2022-03-08 ENCOUNTER — Encounter: Payer: Self-pay | Admitting: Pulmonary Disease

## 2022-03-08 ENCOUNTER — Ambulatory Visit (INDEPENDENT_AMBULATORY_CARE_PROVIDER_SITE_OTHER): Payer: Medicare Other | Admitting: Pulmonary Disease

## 2022-03-08 ENCOUNTER — Other Ambulatory Visit: Payer: Self-pay | Admitting: *Deleted

## 2022-03-08 VITALS — BP 132/62 | HR 92 | Temp 98.5°F | Ht 70.0 in | Wt 303.0 lb

## 2022-03-08 DIAGNOSIS — J449 Chronic obstructive pulmonary disease, unspecified: Secondary | ICD-10-CM

## 2022-03-08 DIAGNOSIS — R918 Other nonspecific abnormal finding of lung field: Secondary | ICD-10-CM | POA: Diagnosis not present

## 2022-03-08 MED ORDER — ALBUTEROL SULFATE HFA 108 (90 BASE) MCG/ACT IN AERS
1.0000 | INHALATION_SPRAY | Freq: Four times a day (QID) | RESPIRATORY_TRACT | 11 refills | Status: DC | PRN
Start: 2022-03-08 — End: 2022-11-26

## 2022-03-08 NOTE — Patient Instructions (Signed)
We will order CT chest without contrast for evaluation of lung nodules Continue Trelegy inhaler Discussed with primary care about weight loss Follow-up in 6 months.

## 2022-03-08 NOTE — Progress Notes (Signed)
Randy Medina    [id number]    Feb 28, 1956  Primary Care Physician:Russo, Jenny Reichmann, MD  Referring Physician: Shon Baton, MD [address] [place],  [place] [address]  Chief complaint:   Follow up for COPD, dyspnea  HPI: Randy Medina is a 66 y.o. with heavy smoking history, active smoker, chronic back pain on opiate medication.  Referred for evaluation of COPD.  He used to live at [place], [place] and recently moved to [place] area in 2017 for his wife's job reason. He still spends his time between [place]. He has chronic pain issues, back pain. His pain doctor is still in [place] and he travels there to get his pain medications from there. As per PCP note he has H/O PE DVT. He saw a hematologist in [place] and was told he had a mutation and will need to be on life long anticoagulation.  Switch from [facility] to Trelegy in 2019.  Symptoms are better with Trelegy inhaler  Interim History:  Continues on Trelegy inhaler.  States that it is working well for him Continues to smoke 1 to 2 cigarettes a day Has gained weight which is affecting his breathing  Outpatient Encounter Medications as of 03/08/2022  Medication Sig   albuterol (VENTOLIN HFA) 108 (90 Base) MCG/ACT inhaler INHALE 1 TO 2 PUFFS INTO THE LUNGS EVERY 6 HOURS AS NEEDED FOR WHEEZING OR SHORTNESS OF BREATH   clonazePAM (KLONOPIN) 1 MG tablet 1 mg.   escitalopram (LEXAPRO) 10 MG tablet Take 10 mg by mouth.   escitalopram (LEXAPRO) 5 MG tablet Take 5 mg by mouth daily.    traZODone (DESYREL) 100 MG tablet Take 200 mg by mouth at bedtime as needed.   TRELEGY ELLIPTA 100-62.5-25 MCG/ACT AEPB INHALE 1 PUFF INTO THE LUNGS DAILY   Vitamin D, Ergocalciferol, (DRISDOL) 50000 units CAPS capsule Take 1 capsule by mouth 2 (two) times a week.   XARELTO 20 MG TABS tablet Take 20 mg by mouth daily.   XTAMPZA ER 36 MG C12A Take 2 capsules by mouth 3 (three) times daily.   [DISCONTINUED] Fluticasone-Umeclidin-Vilant  (TRELEGY ELLIPTA) 100-62.5-25 MCG/ACT AEPB Inhale 1 puff into the lungs daily.   No facility-administered encounter medications on file as of 03/08/2022.   Physical Exam: Blood pressure 132/62, pulse 92, temperature 98.5 F (36.9 C), temperature source Oral, height 5\' 10"  (1.778 m), weight (!) 303 lb (137.4 kg), SpO2 92 %. Gen:      No acute distress HEENT:  EOMI, sclera anicteric Neck:     No masses; no thyromegaly Lungs:    Clear to auscultation bilaterally; normal respiratory effort CV:         Regular rate and rhythm; no murmurs Abd:      + bowel sounds; soft, non-tender; no palpable masses, no distension Ext:    No edema; adequate peripheral perfusion Skin:      Warm and dry; no rash Neuro: alert and oriented x 3 Psych: normal mood and affect   Data Reviewed: Imaging CT chest 05/24/17- groundglass opacities in right upper lobe.  3 mm left upper lobe nodule.  Adrenal nodule.  Aortic atherosclerosis.  CT chest 06/09/2018-mild groundglass opacities with bronchiectasis in the right upper lobe, stable lung nodules.  2.2 cm adrenal nodule  CT chest 08/06/2019-diminished or stable lung nodules, bronchial wall thickening. CT 10/21/2020-stable pulmonary nodules, stable adrenal lesion.   I reviewed the images personally  PFTs 05/09/17 FVC 3.5 (75%], FEV1 2.38 [  68%), F/F 68, TLC 109%.,  RV/TLC 175%, DLCO 95% Moderate obstruction with bronchodilator response, air trapping  Labs CBC 12/02/2017-WBC 8.9, eos 1.3%, absolute eosinophil count 116 Alpha-1 antitrypsin 11/24/2017-163, PI MM  Assessment:  Follow up for COPD Continue Trelegy inhaler, albuterol as needed  Pulmonary nodule Has remained stable on follow-up scan and is likely benign.   Order follow up CT scan chest  History of DVT/PE, hypercoagulable state Continue Xarelto  Active smoker Discussed smoking cessation again.  He is trying to quit with nicotine lozenges Does not want to try the Chantix. Time spent counseling-5 minutes.   Reevaluate next visit  Plan/Recommendations: - Continue trelegy, albuterol as needed - CT chest without contrast - Smoking cessation   Randy Greathouse MD [facility] 03/08/2022, 11:19 AM  CC: Randy Corn, MD

## 2022-03-29 ENCOUNTER — Other Ambulatory Visit: Payer: Medicare Other

## 2022-08-11 ENCOUNTER — Other Ambulatory Visit: Payer: Self-pay | Admitting: Pulmonary Disease

## 2022-08-11 DIAGNOSIS — R0602 Shortness of breath: Secondary | ICD-10-CM

## 2022-08-11 DIAGNOSIS — J449 Chronic obstructive pulmonary disease, unspecified: Secondary | ICD-10-CM

## 2022-09-17 ENCOUNTER — Ambulatory Visit: Payer: Medicare Other | Admitting: Pulmonary Disease

## 2022-10-15 ENCOUNTER — Ambulatory Visit: Payer: Medicare Other | Admitting: Pulmonary Disease

## 2022-11-12 ENCOUNTER — Ambulatory Visit: Payer: Medicare Other | Admitting: Pulmonary Disease

## 2022-11-26 ENCOUNTER — Other Ambulatory Visit: Payer: Self-pay | Admitting: Pulmonary Disease

## 2022-12-10 ENCOUNTER — Ambulatory Visit (INDEPENDENT_AMBULATORY_CARE_PROVIDER_SITE_OTHER): Payer: Medicare Other | Admitting: Nurse Practitioner

## 2022-12-10 ENCOUNTER — Encounter: Payer: Self-pay | Admitting: Nurse Practitioner

## 2022-12-10 VITALS — BP 118/70 | HR 83 | Temp 98.2°F | Ht 70.0 in | Wt 300.8 lb

## 2022-12-10 DIAGNOSIS — Z720 Tobacco use: Secondary | ICD-10-CM

## 2022-12-10 DIAGNOSIS — R918 Other nonspecific abnormal finding of lung field: Secondary | ICD-10-CM | POA: Diagnosis not present

## 2022-12-10 DIAGNOSIS — J449 Chronic obstructive pulmonary disease, unspecified: Secondary | ICD-10-CM | POA: Diagnosis not present

## 2022-12-10 DIAGNOSIS — R351 Nocturia: Secondary | ICD-10-CM

## 2022-12-10 DIAGNOSIS — F17210 Nicotine dependence, cigarettes, uncomplicated: Secondary | ICD-10-CM | POA: Diagnosis not present

## 2022-12-10 MED ORDER — VARENICLINE TARTRATE 0.5 MG PO TABS: ORAL_TABLET | ORAL | 0 refills | Status: AC

## 2022-12-10 MED ORDER — VARENICLINE TARTRATE 1 MG PO TABS: 1.0000 mg | ORAL_TABLET | Freq: Two times a day (BID) | ORAL | 2 refills | Status: AC

## 2022-12-10 NOTE — Assessment & Plan Note (Signed)
Moderate COPD. He feels as though he is compensated on current regimen; although, he is using his rescue inhaler frequently at night. Re-educated on proper use. He is going to monitor his breathing more closely and utilize accordingly. Will reassess at follow up. Smoking cessation advised. Action plan in place.  Patient Instructions  -Continue Albuterol inhaler 2 puffs every 6 hours as needed for shortness of breath or wheezing. Notify if symptoms persist despite rescue inhaler/neb use. Try to back off using it at night and see how your breathing does if you think you're using it more so out of habit than for shortness of breath  -Continue Trelegy 1 puff daily. Brush tongue and rinse mouth afterwards   Referral to lung cancer screening program    Start Chantix - take one 0.5 mg tablet once a day for 3 days then increase to one 0.5 mg tablet twice a day for 4 days. On day 8, you will start 1 mg Twice daily and remain on this for at least 12 weeks. Monitor your mood closely. If you develop any worsening mood changes, stop and notify immediately. Also, if you develop any new onset chest pain, stop and seek emergency care   Follow up in 6 weeks with Dr. Isaiah Serge or Katie Teshawn Moan,NP. If symptoms do not improve or worsen, please contact office for sooner follow up or seek emergency care.

## 2022-12-10 NOTE — Progress Notes (Signed)
@Patient  ID: Randy Medina, male    DOB: [birth year], 67 y.o.   MRN: [id number]  Chief Complaint  Patient presents with   Follow-up    Pt states everything is still the same no concerns. Have been using his trelegy inhaler which works really well.     Referring provider: Creola Corn, MD  HPI: 67 year old male, active smoker followed for COPD and lung nodules. He is a patient of Dr. Shirlee More and last seen in office 03/08/2022. Past medical history significant for PE on Xarelto, anxiety, depression.   TEST/EVENTS:  05/09/2017 PFT: FVC 69, FEV1 60, ratio 68, TLC 109, DLCOcor 98. Moderate obstruction with reversibility and normal diffusing capacity  10/21/2020 CT chest wo contrast: atherosclerosis. No LAD. Small nodular density in the right lung, unchanged. Scarring in lingula. Stable pulmonary nodules, compatible with benign etiology. Well circumscribed right adrenal lesion measures 2.2x1.6 cm, stable >2 years and likely benign.   03/08/2022: OV with Dr. Shana Chute on Trelegy. Feels like this is working well for him. Continues to smoke 1-2 cigarettes a day. Has gained weight which has affected his breathing. Counseled on smoking cessation. Previous pulmonary nodule stable and likely benign. F/u 1 year.   12/10/2022: Today - follow up Patient presents today for follow up. He has been feeling about the same since his last visit. He feels the Trelegy works really well for him. He does get short winded with longer distances and uphill climbing. Some of this is related to mobility issues from joint pains. Notices an occasional wheeze. He denies any significant cough, chest congestion. Denies any hemoptysis, fevers, chills, anorexia, weight loss, leg swelling, orthopnea. He is still smoking - 5-6 cigarettes a day. He wants to quit but is having trouble with this. He uses rescue inhaler 3-4 times a night. He usually wakes up to go to the bathroom and then just reaches for his inhaler when he gets back to  the bed. He thinks maybe this has become a habit and maybe he really doesn't need it.   No Known Allergies  Immunization History  Administered Date(s) Administered   Influenza Split 03/07/2014   Influenza,inj,Quad PF,6+ Mos 05/10/2018, 02/21/2019   Influenza-Unspecified 02/05/2017   PFIZER(Purple Top)SARS-COV-2 Vaccination 08/05/2020    Past Medical History:  Diagnosis Date   Chronic pain    Depression    DVT (deep venous thrombosis) (HCC)    GAD (generalized anxiety disorder)    Hyperglycemia    PE (pulmonary embolism)     Tobacco History: Social History   Tobacco Use  Smoking Status Every Day   Packs/day: 0.75   Years: 35.00   Additional pack years: 0.00   Total pack years: 26.25   Types: Cigarettes   Passive exposure: Current  Smokeless Tobacco Never  Tobacco Comments   2 per day - 1 in morning and 1 at night 03/08/22   Ready to quit: Not Answered Counseling given: Not Answered Tobacco comments: 2 per day - 1 in morning and 1 at night 03/08/22   Outpatient Medications Prior to Visit  Medication Sig Dispense Refill   albuterol (VENTOLIN HFA) 108 (90 Base) MCG/ACT inhaler INHALE 1 TO 2 PUFFS INTO THE LUNGS EVERY 6 HOURS AS NEEDED FOR WHEEZING OR SHORTNESS OF BREATH 6.7 g 11   clonazePAM (KLONOPIN) 1 MG tablet 1 mg.     escitalopram (LEXAPRO) 10 MG tablet Take 10 mg by mouth.     escitalopram (LEXAPRO) 5 MG tablet Take 5 mg by mouth daily.  traZODone (DESYREL) 100 MG tablet Take 200 mg by mouth at bedtime as needed.     TRELEGY ELLIPTA 100-62.5-25 MCG/ACT AEPB INHALE 1 PUFF INTO THE LUNGS DAILY 60 each 5   Vitamin D, Ergocalciferol, (DRISDOL) 50000 units CAPS capsule Take 1 capsule by mouth 2 (two) times a week.  0   XARELTO 20 MG TABS tablet Take 20 mg by mouth daily.  0   XTAMPZA ER 36 MG C12A Take 2 capsules by mouth 3 (three) times daily.  0   No facility-administered medications prior to visit.     Review of Systems:   Constitutional: No weight loss  or gain, night sweats, fevers, chills, fatigue, or lassitude. HEENT: No headaches, difficulty swallowing, tooth/dental problems, or sore throat. No sneezing, itching, ear ache, nasal congestion, or post nasal drip CV:  No chest pain, orthopnea, PND, swelling in lower extremities, anasarca, dizziness, palpitations, syncope Resp: +shortness of breath with exertion; occasional wheeze. No excess mucus or change in color of mucus. No productive or non-productive. No hemoptysis. No chest wall deformity GI:  No heartburn, indigestion GU: No dysuria, change in color of urine, urgency. +nocturia  Skin: No rash, lesions, ulcerations MSK:  No joint pain or swelling.   Neuro: No dizziness or lightheadedness.  Psych: +well controlled depression/anxiety. No SI/HI. Mood stable.     Physical Exam:  BP 118/70   Pulse 83   Temp 98.2 F (36.8 C) (Oral)   Ht 5\' 10"  (1.778 m)   Wt (!) 300 lb 12.8 oz (136.4 kg)   SpO2 92%   BMI 43.16 kg/m   GEN: Pleasant, interactive, well-kempt; morbidly obese; in no acute distress. HEENT:  Normocephalic and atraumatic. PERRLA. Sclera white. Nasal turbinates pink, moist and patent bilaterally. No rhinorrhea present. Oropharynx pink and moist, without exudate or edema. No lesions, ulcerations, or postnasal drip.  NECK:  Supple w/ fair ROM. No JVD present. Normal carotid impulses w/o bruits. Thyroid symmetrical with no goiter or nodules palpated. No lymphadenopathy.   CV: RRR, no m/r/g, no peripheral edema. Pulses intact, +2 bilaterally. No cyanosis, pallor or clubbing. PULMONARY:  Unlabored, regular breathing. End expiratory wheeze b/l, clears with deep inspiration. No accessory muscle use.  GI: BS present and normoactive. Soft, non-tender to palpation. No organomegaly or masses detected.  MSK: No erythema, warmth or tenderness. Cap refil <2 sec all extrem. No deformities or joint swelling noted.  Neuro: A/Ox3. No focal deficits noted.   Skin: Warm, no lesions or  rashe Psych: Normal affect and behavior. Judgement and thought content appropriate.     Lab Results:  CBC    Component Value Date/Time   WBC 8.9 12/02/2017 1506   RBC 4.99 12/02/2017 1506   HGB 14.5 12/02/2017 1506   HCT 44.0 12/02/2017 1506   PLT 223.0 12/02/2017 1506   MCV 88.2 12/02/2017 1506   MCHC 32.9 12/02/2017 1506   RDW 15.9 (H) 12/02/2017 1506   LYMPHSABS 2.3 12/02/2017 1506   MONOABS 0.4 12/02/2017 1506   EOSABS 0.1 12/02/2017 1506   BASOSABS 0.1 12/02/2017 1506    BMET No results found for: "NA", "K", "CL", "CO2", "GLUCOSE", "BUN", "CREATININE", "CALCIUM", "GFRNONAA", "GFRAA"  BNP No results found for: "BNP"   Imaging:  No results found.       Latest Ref Rng & Units 05/09/2017   11:48 AM  PFT Results  FVC-Pre L 3.21   FVC-Predicted Pre % 69   FVC-Post L 3.50   FVC-Predicted Post % 75   Pre FEV1/FVC % %  66   Post FEV1/FCV % % 68   FEV1-Pre L 2.12   FEV1-Predicted Pre % 60   FEV1-Post L 2.38   DLCO uncorrected ml/min/mmHg 29.52   DLCO UNC% % 95   DLCO corrected ml/min/mmHg 30.42   DLCO COR %Predicted % 98   DLVA Predicted % 115   TLC L 7.45   TLC % Predicted % 109   RV % Predicted % 192     No results found for: "NITRICOXIDE"      Assessment & Plan:   COPD (chronic obstructive pulmonary disease) (HCC) Moderate COPD. He feels as though he is compensated on current regimen; although, he is using his rescue inhaler frequently at night. Re-educated on proper use. He is going to monitor his breathing more closely and utilize accordingly. Will reassess at follow up. Smoking cessation advised. Action plan in place.  Patient Instructions  -Continue Albuterol inhaler 2 puffs every 6 hours as needed for shortness of breath or wheezing. Notify if symptoms persist despite rescue inhaler/neb use. Try to back off using it at night and see how your breathing does if you think you're using it more so out of habit than for shortness of breath   -Continue Trelegy 1 puff daily. Brush tongue and rinse mouth afterwards   Referral to lung cancer screening program    Start Chantix - take one 0.5 mg tablet once a day for 3 days then increase to one 0.5 mg tablet twice a day for 4 days. On day 8, you will start 1 mg Twice daily and remain on this for at least 12 weeks. Monitor your mood closely. If you develop any worsening mood changes, stop and notify immediately. Also, if you develop any new onset chest pain, stop and seek emergency care   Follow up in 6 weeks with Dr. Isaiah Serge or Katie Cade Olberding,NP. If symptoms do not improve or worsen, please contact office for sooner follow up or seek emergency care.    Pulmonary nodules Considered benign. Will refer to lung cancer screening program.   Tobacco abuse The patient's current tobacco use: 5-6 cigarettes/day The patient was advised to quit and impact of smoking on their health.  I assessed the patient's willingness to attempt to quit. I provided methods and skills for cessation. We reviewed medication management of smoking session drugs if appropriate. Resources to help quit smoking were provided. A smoking cessation quit date was set: 01/10/2023 Follow-up was arranged in our clinic.  The amount of time spent counseling patient was 5 mins    Started on Chantix. Reviewed side effect profile. He does have a history of depression/anxiety, which is well-controlled. No history of SI/HI. Advised to closely monitor his mood and stop/notify immediately of any worsening. Verbalized understanding.   Nocturia Possibly due to BPH. Will discuss evaluation for sleep apnea at follow up given BMI.    I spent 35 minutes of dedicated to the care of this patient on the date of this encounter to include pre-visit review of records, face-to-face time with the patient discussing conditions above, post visit ordering of testing, clinical documentation with the electronic health record, making appropriate referrals  as documented, and communicating necessary findings to members of the patients care team.  Noemi Chapel, NP 12/10/2022  Pt aware and understands NP's role.

## 2022-12-10 NOTE — Assessment & Plan Note (Signed)
The patient's current tobacco use: 5-6 cigarettes/day The patient was advised to quit and impact of smoking on their health.  I assessed the patient's willingness to attempt to quit. I provided methods and skills for cessation. We reviewed medication management of smoking session drugs if appropriate. Resources to help quit smoking were provided. A smoking cessation quit date was set: 01/10/2023 Follow-up was arranged in our clinic.  The amount of time spent counseling patient was 5 mins    Started on Chantix. Reviewed side effect profile. He does have a history of depression/anxiety, which is well-controlled. No history of SI/HI. Advised to closely monitor his mood and stop/notify immediately of any worsening. Verbalized understanding.

## 2022-12-10 NOTE — Assessment & Plan Note (Signed)
Possibly due to BPH. Will discuss evaluation for sleep apnea at follow up given BMI.

## 2022-12-10 NOTE — Assessment & Plan Note (Signed)
Considered benign. Will refer to lung cancer screening program.

## 2022-12-10 NOTE — Patient Instructions (Addendum)
-  Continue Albuterol inhaler 2 puffs every 6 hours as needed for shortness of breath or wheezing. Notify if symptoms persist despite rescue inhaler/neb use. Try to back off using it at night and see how your breathing does if you think you're using it more so out of habit than for shortness of breath  -Continue Trelegy 1 puff daily. Brush tongue and rinse mouth afterwards   Referral to lung cancer screening program    Start Chantix - take one 0.5 mg tablet once a day for 3 days then increase to one 0.5 mg tablet twice a day for 4 days. On day 8, you will start 1 mg Twice daily and remain on this for at least 12 weeks. Monitor your mood closely. If you develop any worsening mood changes, stop and notify immediately. Also, if you develop any new onset chest pain, stop and seek emergency care   Follow up in 6 weeks with Dr. Isaiah Medina or Randy Vallerie Hentz,NP. If symptoms do not improve or worsen, please contact office for sooner follow up or seek emergency care.

## 2023-01-17 ENCOUNTER — Other Ambulatory Visit: Payer: Self-pay | Admitting: Pulmonary Disease

## 2023-01-17 DIAGNOSIS — R0602 Shortness of breath: Secondary | ICD-10-CM

## 2023-01-17 DIAGNOSIS — J449 Chronic obstructive pulmonary disease, unspecified: Secondary | ICD-10-CM

## 2023-01-20 ENCOUNTER — Telehealth: Payer: Self-pay | Admitting: Pulmonary Disease

## 2023-01-20 NOTE — Telephone Encounter (Signed)
Patient is on last dose of trelegy, needs refill. Please advise

## 2023-01-21 MED ORDER — TRELEGY ELLIPTA 100-62.5-25 MCG/ACT IN AEPB: 1.0000 | INHALATION_SPRAY | Freq: Every day | RESPIRATORY_TRACT | 5 refills | Status: DC

## 2023-01-21 NOTE — Telephone Encounter (Signed)
Trelegy refill has been sent to pharmacy. Voice mail sent to patient about the refill.  Nothing further needed

## 2023-01-24 ENCOUNTER — Encounter: Payer: Self-pay | Admitting: Nurse Practitioner

## 2023-01-24 ENCOUNTER — Ambulatory Visit: Payer: Medicare Other | Admitting: Nurse Practitioner

## 2023-01-24 VITALS — BP 128/70 | HR 85 | Temp 98.5°F | Ht 70.0 in | Wt 313.8 lb

## 2023-01-24 DIAGNOSIS — J449 Chronic obstructive pulmonary disease, unspecified: Secondary | ICD-10-CM | POA: Diagnosis not present

## 2023-01-24 DIAGNOSIS — F17210 Nicotine dependence, cigarettes, uncomplicated: Secondary | ICD-10-CM | POA: Diagnosis not present

## 2023-01-24 DIAGNOSIS — R918 Other nonspecific abnormal finding of lung field: Secondary | ICD-10-CM | POA: Diagnosis not present

## 2023-01-24 DIAGNOSIS — Z720 Tobacco use: Secondary | ICD-10-CM

## 2023-01-24 NOTE — Assessment & Plan Note (Addendum)
Active smoker with 26 pack year hx. See above. Continued smoking cessation encouraged. He will let us know if he changes his mind about starting Chantix. Discussed other potential NRT. He would like to hold off for right now.

## 2023-01-24 NOTE — Assessment & Plan Note (Signed)
Stable on previous imaging. Referral to lung cancer screening program.

## 2023-01-24 NOTE — Progress Notes (Signed)
@Patient  ID: Randy Medina, male    DOB: Jan 07, 1956, 67 y.o.   MRN: [id number]  Chief Complaint  Patient presents with   Follow-up    Pt is here for COPD F/U visit. Pt states no coughing and no SOB. Pt complains of sweating.    Referring provider: Creola Corn, MD  HPI: 67 year old male, active smoker followed for COPD and lung nodules. He is a patient of Dr. Shirlee More and last seen in office 12/10/2022 by [facility] NP. Past medical history significant for PE on Xarelto, anxiety, depression.   TEST/EVENTS:  05/09/2017 PFT: FVC 69, FEV1 60, ratio 68, TLC 109, DLCOcor 98. Moderate obstruction with reversibility and normal diffusing capacity  10/21/2020 CT chest wo contrast: atherosclerosis. No LAD. Small nodular density in the right lung, unchanged. Scarring in lingula. Stable pulmonary nodules, compatible with benign etiology. Well circumscribed right adrenal lesion measures 2.2x1.6 cm, stable >2 years and likely benign.   03/08/2022: OV with Dr. Shana Chute on Trelegy. Feels like this is working well for him. Continues to smoke 1-2 cigarettes a day. Has gained weight which has affected his breathing. Counseled on smoking cessation. Previous pulmonary nodule stable and likely benign. F/u 1 year.   12/10/2022: OV with Ilissa Rosner NP for follow up. He has been feeling about the same since his last visit. He feels the Trelegy works really well for him. He does get short winded with longer distances and uphill climbing. Some of this is related to mobility issues from joint pains. Notices an occasional wheeze. He denies any significant cough, chest congestion. Denies any hemoptysis, fevers, chills, anorexia, weight loss, leg swelling, orthopnea. He is still smoking - 5-6 cigarettes a day. He wants to quit but is having trouble with this. He uses rescue inhaler 3-4 times a night. He usually wakes up to go to the bathroom and then just reaches for his inhaler when he gets back to the bed. He thinks maybe this has  become a habit and maybe he really doesn't need it.   01/24/2023: Today - follow up Patient presents today for follow up. He never started the Chantix after our last visit. He's been able to cut back on his own; down to 2 cigarettes a day now. He'd like to continue working on this on his own. Worried about the side effects of other medications. His breathing is stable. Mostly has trouble with uphill climbing and longer distances due to joint pains. Denies significant cough, chest congestion, hemoptysis, anorexia, weight loss. He has had some weight gain with cutting back on smoking. Trying to work on increasing his activity.   No Known Allergies  Immunization History  Administered Date(s) Administered   Influenza Split 03/07/2014   Influenza,inj,Quad PF,6+ Mos 05/10/2018, 02/21/2019   Influenza-Unspecified 02/05/2017   PFIZER(Purple Top)SARS-COV-2 Vaccination 08/05/2020    Past Medical History:  Diagnosis Date   Chronic pain    Depression    DVT (deep venous thrombosis) (HCC)    GAD (generalized anxiety disorder)    Hyperglycemia    PE (pulmonary embolism)     Tobacco History: Social History   Tobacco Use  Smoking Status Every Day   Current packs/day: 0.75   Average packs/day: 0.8 packs/day for 35.0 years (26.3 ttl pk-yrs)   Types: Cigarettes   Passive exposure: Current  Smokeless Tobacco Never  Tobacco Comments   2 per day - 1 in morning and 1 at night 03/08/22   Ready to quit: Yes Counseling given: Not Answered Tobacco comments:  2 per day - 1 in morning and 1 at night 03/08/22   Outpatient Medications Prior to Visit  Medication Sig Dispense Refill   albuterol (VENTOLIN HFA) 108 (90 Base) MCG/ACT inhaler INHALE 1 TO 2 PUFFS INTO THE LUNGS EVERY 6 HOURS AS NEEDED FOR WHEEZING OR SHORTNESS OF BREATH 6.7 g 11   clonazePAM (KLONOPIN) 1 MG tablet 1 mg.     escitalopram (LEXAPRO) 10 MG tablet Take 10 mg by mouth daily.     escitalopram (LEXAPRO) 20 MG tablet Take 20 mg by  mouth at bedtime.     Fluticasone-Umeclidin-Vilant (TRELEGY ELLIPTA) 100-62.5-25 MCG/ACT AEPB Inhale 1 puff into the lungs daily. 1 each 5   traZODone (DESYREL) 100 MG tablet Take 200 mg by mouth at bedtime as needed.     varenicline (CHANTIX CONTINUING MONTH PAK) 1 MG tablet Take 1 tablet (1 mg total) by mouth 2 (two) times daily. 60 tablet 2   Vitamin D, Ergocalciferol, (DRISDOL) 50000 units CAPS capsule Take 1 capsule by mouth 2 (two) times a week.  0   XARELTO 20 MG TABS tablet Take 20 mg by mouth daily.  0   XTAMPZA ER 36 MG C12A Take 2 capsules by mouth 3 (three) times daily.  0   escitalopram (LEXAPRO) 5 MG tablet Take 5 mg by mouth daily.      No facility-administered medications prior to visit.     Review of Systems:   Constitutional: No weight loss or gain, night sweats, fevers, chills, fatigue, or lassitude. HEENT: No headaches, difficulty swallowing, tooth/dental problems, or sore throat. No sneezing, itching, ear ache, nasal congestion, or post nasal drip CV:  No chest pain, orthopnea, PND, swelling in lower extremities, anasarca, dizziness, palpitations, syncope Resp: +shortness of breath with exertion; occasional wheeze. No excess mucus or change in color of mucus. No productive or non-productive. No hemoptysis. No chest wall deformity GI:  No heartburn, indigestion GU: No dysuria, change in color of urine, urgency. +nocturia  Skin: No rash, lesions, ulcerations MSK:  +chronic joint pains Neuro: No dizziness or lightheadedness.  Psych: +well controlled depression/anxiety. No SI/HI. Mood stable.     Physical Exam:  BP 128/70 (BP Location: Right Arm, Cuff Size: Large)   Pulse 85   Temp 98.5 F (36.9 C) (Oral)   Ht 5\' 10"  (1.778 m)   Wt (!) 313 lb 12.8 oz (142.3 kg)   SpO2 90%   BMI 45.03 kg/m   GEN: Pleasant, interactive, well-kempt; morbidly obese; in no acute distress. HEENT:  Normocephalic and atraumatic. PERRLA. Sclera white. Nasal turbinates pink, moist and  patent bilaterally. No rhinorrhea present. Oropharynx pink and moist, without exudate or edema. No lesions, ulcerations, or postnasal drip.  NECK:  Supple w/ fair ROM. No JVD present. Normal carotid impulses w/o bruits. Thyroid symmetrical with no goiter or nodules palpated. No lymphadenopathy.   CV: RRR, no m/r/g, no peripheral edema. Pulses intact, +2 bilaterally. No cyanosis, pallor or clubbing. PULMONARY:  Unlabored, regular breathing. End expiratory wheeze b/l, clears with deep inspiration. No accessory muscle use.  GI: BS present and normoactive. Soft, non-tender to palpation. No organomegaly or masses detected.  MSK: No erythema, warmth or tenderness. Cap refil <2 sec all extrem. No deformities or joint swelling noted.  Neuro: A/Ox3. No focal deficits noted.   Skin: Warm, no lesions or rashe Psych: Normal affect and behavior. Judgement and thought content appropriate.     Lab Results:  CBC    Component Value Date/Time   WBC 8.9  12/02/2017 1506   RBC 4.99 12/02/2017 1506   HGB 14.5 12/02/2017 1506   HCT 44.0 12/02/2017 1506   PLT 223.0 12/02/2017 1506   MCV 88.2 12/02/2017 1506   MCHC 32.9 12/02/2017 1506   RDW 15.9 (H) 12/02/2017 1506   LYMPHSABS 2.3 12/02/2017 1506   MONOABS 0.4 12/02/2017 1506   EOSABS 0.1 12/02/2017 1506   BASOSABS 0.1 12/02/2017 1506    BMET No results found for: "NA", "K", "CL", "CO2", "GLUCOSE", "BUN", "CREATININE", "CALCIUM", "GFRNONAA", "GFRAA"  BNP No results found for: "BNP"   Imaging:  No results found.  Administration History     None          Latest Ref Rng & Units 05/09/2017   11:48 AM  PFT Results  FVC-Pre L 3.21   FVC-Predicted Pre % 69   FVC-Post L 3.50   FVC-Predicted Post % 75   Pre FEV1/FVC % % 66   Post FEV1/FCV % % 68   FEV1-Pre L 2.12   FEV1-Predicted Pre % 60   FEV1-Post L 2.38   DLCO uncorrected ml/min/mmHg 29.52   DLCO UNC% % 95   DLCO corrected ml/min/mmHg 30.42   DLCO COR %Predicted % 98   DLVA  Predicted % 115   TLC L 7.45   TLC % Predicted % 109   RV % Predicted % 192     No results found for: "NITRICOXIDE"      Assessment & Plan:   COPD (chronic obstructive pulmonary disease) (HCC) Compensated on current regimen. Minimal symptom burden. Action plan in place.   Patient Instructions  -Continue Albuterol inhaler 2 puffs every 6 hours as needed for shortness of breath or wheezing. Notify if symptoms persist despite rescue inhaler/neb use. Try to back off using it at night and see how your breathing does if you think you're using it more so out of habit than for shortness of breath  -Continue Trelegy 1 puff daily. Brush tongue and rinse mouth afterwards    Referral to lung cancer screening program     Follow up in 4 months with Dr. Isaiah Serge or Katie Abdiaziz Klahn,NP. If symptoms do not improve or worsen, please contact office for sooner follow up or seek emergency care.    Pulmonary nodules Stable on previous imaging. Referral to lung cancer screening program.   Tobacco abuse Active smoker with 26 pack year hx. See above. Continued smoking cessation encouraged. He will let us know if he changes his mind about starting Chantix. Discussed other potential NRT. He would like to hold off for right now.     I spent 31 minutes of dedicated to the care of this patient on the date of this encounter to include pre-visit review of records, face-to-face time with the patient discussing conditions above, post visit ordering of testing, clinical documentation with the electronic health record, making appropriate referrals as documented, and communicating necessary findings to members of the patients care team.  Noemi Chapel, NP 01/24/2023  Pt aware and understands NP's role.

## 2023-01-24 NOTE — Assessment & Plan Note (Signed)
Compensated on current regimen. Minimal symptom burden. Action plan in place.   Patient Instructions  -Continue Albuterol inhaler 2 puffs every 6 hours as needed for shortness of breath or wheezing. Notify if symptoms persist despite rescue inhaler/neb use. Try to back off using it at night and see how your breathing does if you think you're using it more so out of habit than for shortness of breath  -Continue Trelegy 1 puff daily. Brush tongue and rinse mouth afterwards    Referral to lung cancer screening program     Follow up in 4 months with Dr. Isaiah Medina or Randy Jahmeer Porche,NP. If symptoms do not improve or worsen, please contact office for sooner follow up or seek emergency care.

## 2023-01-24 NOTE — Patient Instructions (Addendum)
-  Continue Albuterol inhaler 2 puffs every 6 hours as needed for shortness of breath or wheezing. Notify if symptoms persist despite rescue inhaler/neb use. Try to back off using it at night and see how your breathing does if you think you're using it more so out of habit than for shortness of breath  -Continue Trelegy 1 puff daily. Brush tongue and rinse mouth afterwards    Referral to lung cancer screening program     Follow up in 4 months with Dr. Isaiah Medina or Randy Jaline Pincock,NP. If symptoms do not improve or worsen, please contact office for sooner follow up or seek emergency care.

## 2023-06-24 ENCOUNTER — Other Ambulatory Visit: Payer: Self-pay | Admitting: Pulmonary Disease

## 2023-09-01 ENCOUNTER — Other Ambulatory Visit: Payer: Self-pay | Admitting: Pulmonary Disease

## 2023-10-04 ENCOUNTER — Other Ambulatory Visit: Payer: Self-pay | Admitting: Pulmonary Disease

## 2024-01-29 ENCOUNTER — Other Ambulatory Visit: Payer: Self-pay | Admitting: Pulmonary Disease

## 2024-04-03 ENCOUNTER — Other Ambulatory Visit: Payer: Self-pay | Admitting: Pulmonary Disease
